# Patient Record
Sex: Female | Born: 1985 | Race: Black or African American | Hispanic: No | Marital: Single | Smoking: Never smoker
Health system: Southern US, Community
[De-identification: ages and names within clinical notes are randomized; demographics above are authoritative.]

## PROBLEM LIST (undated history)

## (undated) DIAGNOSIS — M419 Scoliosis, unspecified: Secondary | ICD-10-CM

## (undated) DIAGNOSIS — M1990 Unspecified osteoarthritis, unspecified site: Secondary | ICD-10-CM

## (undated) DIAGNOSIS — E119 Type 2 diabetes mellitus without complications: Secondary | ICD-10-CM

## (undated) DIAGNOSIS — M549 Dorsalgia, unspecified: Secondary | ICD-10-CM

## (undated) DIAGNOSIS — G43909 Migraine, unspecified, not intractable, without status migrainosus: Secondary | ICD-10-CM

## (undated) HISTORY — PX: CHOLECYSTECTOMY: SHX55

## (undated) HISTORY — PX: FOOT SURGERY: SHX648

---
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
## 2003-07-20 ENCOUNTER — Emergency Department (HOSPITAL_COMMUNITY): Admission: EM | Admit: 2003-07-20 | Discharge: 2003-07-20 | Payer: Self-pay | Admitting: Emergency Medicine

## 2009-12-07 ENCOUNTER — Ambulatory Visit: Payer: Self-pay | Admitting: Diagnostic Radiology

## 2009-12-07 ENCOUNTER — Emergency Department (HOSPITAL_BASED_OUTPATIENT_CLINIC_OR_DEPARTMENT_OTHER): Admission: EM | Admit: 2009-12-07 | Discharge: 2009-12-07 | Payer: Self-pay | Admitting: Emergency Medicine

## 2011-06-09 ENCOUNTER — Encounter: Payer: Self-pay | Admitting: Emergency Medicine

## 2011-06-09 DIAGNOSIS — M795 Residual foreign body in soft tissue: Secondary | ICD-10-CM | POA: Insufficient documentation

## 2011-06-09 DIAGNOSIS — Z1881 Retained glass fragments: Secondary | ICD-10-CM | POA: Insufficient documentation

## 2011-06-09 NOTE — ED Notes (Signed)
Pt presents with knot on bottom on left foot. Pt says she stepped on glass about a month ago and she removed the glass from her foot. Pt says the pain has became worse in the past few days.

## 2011-06-10 ENCOUNTER — Emergency Department (HOSPITAL_BASED_OUTPATIENT_CLINIC_OR_DEPARTMENT_OTHER)
Admission: EM | Admit: 2011-06-10 | Discharge: 2011-06-10 | Disposition: A | Discharge status: Home / Self Care | Payer: Self-pay | Attending: Emergency Medicine | Admitting: Emergency Medicine

## 2011-06-10 DIAGNOSIS — S90852A Superficial foreign body, left foot, initial encounter: Secondary | ICD-10-CM

## 2011-06-10 HISTORY — DX: Scoliosis, unspecified: M41.9

## 2011-06-10 HISTORY — DX: Unspecified osteoarthritis, unspecified site: M19.90

## 2011-06-10 MED ORDER — HYDROCODONE-ACETAMINOPHEN 5-325 MG PO TABS
1.0000 | ORAL_TABLET | Freq: Once | ORAL | Status: AC
  Administered 2011-06-10: 1 via ORAL
  Filled 2011-06-10: qty 1

## 2011-06-10 MED ORDER — MUPIROCIN CALCIUM 2 % EX CREA
TOPICAL_CREAM | Freq: Two times a day (BID) | CUTANEOUS | Status: DC
  Administered 2011-06-10 (×2): via TOPICAL
  Filled 2011-06-10: qty 15

## 2011-06-10 MED ORDER — CEPHALEXIN 500 MG PO CAPS: 500.0000 mg | ORAL_CAPSULE | Freq: Four times a day (QID) | ORAL | Status: AC

## 2011-06-10 MED ORDER — CEPHALEXIN 250 MG PO CAPS
500.0000 mg | ORAL_CAPSULE | Freq: Once | ORAL | Status: AC
  Administered 2011-06-10: 500 mg via ORAL
  Filled 2011-06-10: qty 2

## 2011-06-10 NOTE — ED Provider Notes (Signed)
History   Scribed for Brittany Seamen, MD, the patient was seen in room MH03/MH03 . This chart was scribed by Brittany Oneill. This patient's care was started at 12:56 AM .    CSN: [id number] Arrival date & time: 06/10/2011 12:07 AM  Chief Complaint  Patient presents with  . Foot Injury   HPI Brittany Oneill is a 25 y.o. female who presents to the Emergency Department complaining of knot on bottom of left foot that has been present for 1 day.  Pain has became worse over the past few days. Patient states that she stepped on a piece of glass about a month ago. She thought she removed all of the glass at the time but believes this knot is associated with stepping on glass a month ago. Pt has no SOB, diaphoresis, or acute distress.   HPI ELEMENTS:  Location: left plantar side of foot Onset: yesterday Duration: 1 day   Quality:progressive pain Context: as above       PAST MEDICAL HISTORY:  Past Medical History  Diagnosis Date  . Scoliosis   . Asthma   . Arthritis      PAST SURGICAL HISTORY:  History reviewed. No pertinent past surgical history.   MEDICATIONS:  Previous Medications   HYDROCODONE-ACETAMINOPHEN (NORCO) 10-325 MG PER TABLET    Take 1 tablet by mouth every 6 (six) hours as needed.     KETOROLAC (TORADOL) 10 MG TABLET    Take 10 mg by mouth every 6 (six) hours as needed.       ALLERGIES:  Allergies as of 06/09/2011 - Review Complete 06/09/2011  Allergen Reaction Noted  . Penicillins  06/09/2011  . Sulfa antibiotics  06/09/2011     FAMILY HISTORY:   History reviewed. No pertinent family history.   SOCIAL HISTORY: History  Substance Use Topics  . Smoking status: Never Smoker   . Smokeless tobacco: Not on file  . Alcohol Use: Yes     social    OB History    Grav Para Term Preterm Abortions TAB SAB Ect Mult Living                  Review of Systems 10 Systems reviewed and are negative for acute change except as noted in the HPI.  Physical Exam  BP  127/58  Pulse 67  Temp(Src) 98.7 F (37.1 C) (Oral)  Resp 18  SpO2 99%  Physical Exam General: Well-developed, well-nourished female in no acute distress; appearance consistent with age of record HENT: normocephalic, atraumatic Eyes: pupils equal round and reactive to light; extraocular muscles intact Neck: supple Heart: regular rate and rhythm; no murmurs, rubs or gallops Lungs: clear to auscultation bilaterally Abdomen: soft; nontender; nondistended; no masses or hepatosplenomegaly; bowel sounds present Extremities: No deformity; full range of motion; pulses normal; knot on plantar side of left foot with central punctum Neurologic: Awake, alert and oriented;motor function intact in all extremities and symmetric;sensation grossly intact; no facial droop Skin: Warm and dry  Psychiatric: Normal mood    ED Course  FOREIGN BODY REMOVAL Date/Time: 06/10/2011 1:14 AM Performed by: Brittany Oneill Authorized by: Brittany Oneill Consent: Verbal consent obtained. Intake: sole of left foot. Anesthesia: local infiltration Local anesthetic: lidocaine 2% with epinephrine Anesthetic total: 4 ml Complexity: simple 1 objects recovered. Objects recovered: glass shard Post-procedure assessment: foreign body removed Patient tolerance: Patient tolerated the procedure well with no immediate complications.    MDM Will leave wound open and treat with antibiotic. Patient  advised of possibility of retained foreign body, need for reevaluation if worsening.      Brittany Seamen, MD 06/10/11 [phone]

## 2011-06-10 NOTE — ED Notes (Signed)
Pt is sobbing and shaking. Pt has been given pain medication, dressing applied to incision.

## 2013-08-15 ENCOUNTER — Encounter (HOSPITAL_BASED_OUTPATIENT_CLINIC_OR_DEPARTMENT_OTHER): Payer: Self-pay | Admitting: Emergency Medicine

## 2013-08-15 ENCOUNTER — Emergency Department (HOSPITAL_BASED_OUTPATIENT_CLINIC_OR_DEPARTMENT_OTHER)
Admission: EM | Admit: 2013-08-15 | Discharge: 2013-08-15 | Disposition: A | Discharge status: Home / Self Care | Payer: Medicaid Other | Attending: Emergency Medicine | Admitting: Emergency Medicine

## 2013-08-15 DIAGNOSIS — J45909 Unspecified asthma, uncomplicated: Secondary | ICD-10-CM | POA: Insufficient documentation

## 2013-08-15 DIAGNOSIS — Z791 Long term (current) use of non-steroidal anti-inflammatories (NSAID): Secondary | ICD-10-CM | POA: Insufficient documentation

## 2013-08-15 DIAGNOSIS — R52 Pain, unspecified: Secondary | ICD-10-CM | POA: Insufficient documentation

## 2013-08-15 DIAGNOSIS — M129 Arthropathy, unspecified: Secondary | ICD-10-CM | POA: Insufficient documentation

## 2013-08-15 DIAGNOSIS — Z792 Long term (current) use of antibiotics: Secondary | ICD-10-CM | POA: Insufficient documentation

## 2013-08-15 DIAGNOSIS — G43909 Migraine, unspecified, not intractable, without status migrainosus: Secondary | ICD-10-CM | POA: Insufficient documentation

## 2013-08-15 DIAGNOSIS — Z880 Allergy status to penicillin: Secondary | ICD-10-CM | POA: Insufficient documentation

## 2013-08-15 DIAGNOSIS — Z79899 Other long term (current) drug therapy: Secondary | ICD-10-CM | POA: Insufficient documentation

## 2013-08-15 DIAGNOSIS — J0100 Acute maxillary sinusitis, unspecified: Secondary | ICD-10-CM

## 2013-08-15 HISTORY — DX: Migraine, unspecified, not intractable, without status migrainosus: G43.909

## 2013-08-15 MED ORDER — DOXYCYCLINE HYCLATE 100 MG PO CAPS: 100.0000 mg | ORAL_CAPSULE | Freq: Two times a day (BID) | ORAL | Status: DC

## 2013-08-15 MED ORDER — HYDROCODONE-ACETAMINOPHEN 5-325 MG PO TABS: 2.0000 | ORAL_TABLET | ORAL | Status: DC | PRN

## 2013-08-15 NOTE — ED Provider Notes (Signed)
CSN: [id number]     Arrival date & time 08/15/13  4098 History   First MD Initiated Contact with Patient 08/15/13 0840     Chief Complaint  Patient presents with  . Facial Pain   HPI Patient c/o sinus pain drainage,HA, productive cough for the past three days, generalized body aches. Took OTC cough medicine & allegra D, but no relief. Took flexeril for body aches  Past Medical History  Diagnosis Date  . Scoliosis   . Asthma   . Arthritis   . Migraines    History reviewed. No pertinent past surgical history. No family history on file. History  Substance Use Topics  . Smoking status: Never Smoker   . Smokeless tobacco: Not on file  . Alcohol Use: Yes     Comment: social   OB History   Grav Para Term Preterm Abortions TAB SAB Ect Mult Living                 Review of Systems All other systems reviewed and are negative Allergies  Penicillins and Sulfa antibiotics  Home Medications   Current Outpatient Rx  Name  Route  Sig  Dispense  Refill  . Diclofenac Sodium CR (VOLTAREN-XR) 100 MG 24 hr tablet   Oral   Take 100 mg by mouth daily.         Marland Kitchen tiZANidine (ZANAFLEX) 4 MG tablet   Oral   Take 4 mg by mouth every 6 (six) hours as needed.         . Topiramate (TOPAMAX PO)   Oral   Take by mouth.         . venlafaxine (EFFEXOR) 37.5 MG tablet   Oral   Take 37.5 mg by mouth 2 (two) times daily.         Marland Kitchen doxycycline (VIBRAMYCIN) 100 MG capsule   Oral   Take 1 capsule (100 mg total) by mouth 2 (two) times daily.   20 capsule   0   . HYDROcodone-acetaminophen (NORCO/VICODIN) 5-325 MG per tablet   Oral   Take 2 tablets by mouth every 4 (four) hours as needed for pain.   10 tablet   0   . ketorolac (TORADOL) 10 MG tablet   Oral   Take 10 mg by mouth every 6 (six) hours as needed.            BP 119/64  Pulse 73  Temp(Src) 98.7 F (37.1 C) (Oral)  Resp 19  SpO2 98% Physical Exam  Nursing note and vitals reviewed. Constitutional: She is  oriented to person, place, and time. She appears well-developed and well-nourished. No distress.  HENT:  Head: Normocephalic and atraumatic.    Right Ear: Tympanic membrane is erythematous. A middle ear effusion is present. No decreased hearing is noted.  Left Ear: No drainage or swelling.  No middle ear effusion. No decreased hearing is noted.  Percussion of the indicated areas reproduces pain  Eyes: Pupils are equal, round, and reactive to light.  Neck: Normal range of motion.  Cardiovascular: Normal rate and intact distal pulses.   Pulmonary/Chest: No respiratory distress.  Abdominal: Normal appearance. She exhibits no distension.  Musculoskeletal: Normal range of motion.  Neurological: She is alert and oriented to person, place, and time. No cranial nerve deficit.  Skin: Skin is warm and dry. No rash noted.  Psychiatric: She has a normal mood and affect. Her behavior is normal.    ED Course  Procedures (including critical care time)  Labs Review Labs Reviewed - No data to display Imaging Review No results found.  EKG Interpretation   None       MDM   1. Acute maxillary sinusitis        Nelia Shi, MD 08/15/13 [phone]

## 2013-08-15 NOTE — ED Notes (Addendum)
Patient c/o sinus pain drainage,HA, productive cough for the past three days, generalized body aches. Took OTC cough medicine & allegra D, but no relief. Took flexeril for body aches

## 2013-10-22 ENCOUNTER — Encounter (HOSPITAL_BASED_OUTPATIENT_CLINIC_OR_DEPARTMENT_OTHER): Payer: Self-pay | Admitting: Emergency Medicine

## 2013-10-22 ENCOUNTER — Emergency Department (HOSPITAL_BASED_OUTPATIENT_CLINIC_OR_DEPARTMENT_OTHER)
Admission: EM | Admit: 2013-10-22 | Discharge: 2013-10-23 | Disposition: A | Discharge status: Home / Self Care | Payer: Medicaid Other | Attending: Emergency Medicine | Admitting: Emergency Medicine

## 2013-10-22 DIAGNOSIS — M129 Arthropathy, unspecified: Secondary | ICD-10-CM | POA: Insufficient documentation

## 2013-10-22 DIAGNOSIS — J45909 Unspecified asthma, uncomplicated: Secondary | ICD-10-CM | POA: Insufficient documentation

## 2013-10-22 DIAGNOSIS — M545 Low back pain, unspecified: Secondary | ICD-10-CM | POA: Insufficient documentation

## 2013-10-22 DIAGNOSIS — M62830 Muscle spasm of back: Secondary | ICD-10-CM

## 2013-10-22 DIAGNOSIS — G43909 Migraine, unspecified, not intractable, without status migrainosus: Secondary | ICD-10-CM | POA: Insufficient documentation

## 2013-10-22 DIAGNOSIS — Z880 Allergy status to penicillin: Secondary | ICD-10-CM | POA: Insufficient documentation

## 2013-10-22 DIAGNOSIS — Z791 Long term (current) use of non-steroidal anti-inflammatories (NSAID): Secondary | ICD-10-CM | POA: Insufficient documentation

## 2013-10-22 DIAGNOSIS — M5380 Other specified dorsopathies, site unspecified: Secondary | ICD-10-CM | POA: Insufficient documentation

## 2013-10-22 HISTORY — DX: Dorsalgia, unspecified: M54.9

## 2013-10-22 NOTE — ED Notes (Signed)
Back pain onset 2 days ago getting into tub

## 2013-10-23 MED ORDER — METHOCARBAMOL 500 MG PO TABS
1000.0000 mg | ORAL_TABLET | Freq: Once | ORAL | Status: AC
  Administered 2013-10-23: 1000 mg via ORAL
  Filled 2013-10-23: qty 2

## 2013-10-23 MED ORDER — MELOXICAM 7.5 MG PO TABS: 7.5000 mg | ORAL_TABLET | Freq: Every day | ORAL | Status: DC

## 2013-10-23 MED ORDER — HYDROCODONE-ACETAMINOPHEN 5-325 MG PO TABS: 1.0000 | ORAL_TABLET | Freq: Four times a day (QID) | ORAL | Status: DC | PRN

## 2013-10-23 MED ORDER — METHOCARBAMOL 500 MG PO TABS: 500.0000 mg | ORAL_TABLET | Freq: Two times a day (BID) | ORAL | Status: DC

## 2013-10-23 MED ORDER — KETOROLAC TROMETHAMINE 60 MG/2ML IM SOLN
60.0000 mg | Freq: Once | INTRAMUSCULAR | Status: AC
  Administered 2013-10-23: 60 mg via INTRAMUSCULAR
  Filled 2013-10-23: qty 2

## 2013-10-23 MED ORDER — HYDROCODONE-ACETAMINOPHEN 5-325 MG PO TABS
1.0000 | ORAL_TABLET | Freq: Once | ORAL | Status: AC
  Administered 2013-10-23: 1 via ORAL
  Filled 2013-10-23: qty 1

## 2013-10-23 NOTE — ED Provider Notes (Signed)
CSN: [id number]     Arrival date & time 10/22/13  2326 History   First MD Initiated Contact with Patient 10/23/13 [phone]     Chief Complaint  Patient presents with  . Back Pain   (Consider location/radiation/quality/duration/timing/severity/associated sxs/prior Treatment) Patient is a 28 y.o. female presenting with back pain. The history is provided by the patient.  Back Pain Location:  Sacro-iliac joint Quality:  Cramping Radiates to:  Does not radiate Pain severity:  Severe Pain is:  Same all the time Onset quality:  Gradual Duration:  2 days Timing:  Constant Progression:  Unchanged Chronicity:  Recurrent Context: not emotional stress, not falling, not MCA and not MVA   Relieved by:  Nothing Worsened by:  Nothing tried Ineffective treatments:  None tried Associated symptoms: no abdominal pain, no abdominal swelling, no bladder incontinence, no bowel incontinence, no chest pain, no dysuria, no fever, no headaches, no leg pain, no numbness, no paresthesias, no pelvic pain, no perianal numbness, no tingling, no weakness and no weight loss   Risk factors: no hx of cancer     Past Medical History  Diagnosis Date  . Scoliosis   . Asthma   . Arthritis   . Migraines   . Back pain    Past Surgical History  Procedure Laterality Date  . Cesarean section     No family history on file. History  Substance Use Topics  . Smoking status: Never Smoker   . Smokeless tobacco: Not on file  . Alcohol Use: Yes     Comment: social   OB History   Grav Para Term Preterm Abortions TAB SAB Ect Mult Living                 Review of Systems  Constitutional: Negative for fever and weight loss.  Cardiovascular: Negative for chest pain.  Gastrointestinal: Negative for abdominal pain and bowel incontinence.  Genitourinary: Negative for bladder incontinence, dysuria and pelvic pain.  Musculoskeletal: Positive for back pain.  Neurological: Negative for tingling, weakness, numbness, headaches  and paresthesias.  All other systems reviewed and are negative.    Allergies  Penicillins and Sulfa antibiotics  Home Medications   Current Outpatient Rx  Name  Route  Sig  Dispense  Refill  . cyclobenzaprine (FLEXERIL) 10 MG tablet   Oral   Take 10 mg by mouth 3 (three) times daily as needed for muscle spasms.         . Diclofenac Sodium CR (VOLTAREN-XR) 100 MG 24 hr tablet   Oral   Take 100 mg by mouth daily.         Marland Kitchen ketorolac (TORADOL) 10 MG tablet   Oral   Take 10 mg by mouth every 6 (six) hours as needed.            BP 151/71  Pulse 71  Temp(Src) 98.1 F (36.7 C) (Oral)  Resp 18  Ht 5\' 10"  (1.778 m)  Wt 340 lb (154.223 kg)  BMI 48.78 kg/m2  SpO2 100% Physical Exam  Constitutional: She is oriented to person, place, and time. She appears well-developed and well-nourished. No distress.  HENT:  Head: Normocephalic and atraumatic.  Mouth/Throat: Oropharynx is clear and moist.  Eyes: Conjunctivae are normal. Pupils are equal, round, and reactive to light.  Neck: Normal range of motion. Neck supple.  Cardiovascular: Normal rate, regular rhythm and intact distal pulses.   Pulmonary/Chest: Effort normal and breath sounds normal. She has no wheezes. She has no rales.  Abdominal:  Soft. Bowel sounds are normal. There is no tenderness. There is no rebound and no guarding.  Musculoskeletal: Normal range of motion.  Neurological: She is alert and oriented to person, place, and time. She has normal reflexes.  Intact L5/s1 no step offs nor crepitance nor point tenderness of the c t or l spine.  Mild left lumbar paraspinal tenderness    ED Course  Procedures (including critical care time) Labs Review Labs Reviewed  PREGNANCY, URINE   Imaging Review No results found.  EKG Interpretation   None       MDM  No diagnosis found. Short course of pain medication and muscle relaxants    Marshelle Bilger K Zalaya Astarita-Rasch, MD 10/23/13 [phone]

## 2014-05-19 ENCOUNTER — Emergency Department (HOSPITAL_BASED_OUTPATIENT_CLINIC_OR_DEPARTMENT_OTHER)
Admission: EM | Admit: 2014-05-19 | Discharge: 2014-05-19 | Disposition: A | Discharge status: Home / Self Care | Payer: Medicaid Other | Attending: Emergency Medicine | Admitting: Emergency Medicine

## 2014-05-19 ENCOUNTER — Encounter (HOSPITAL_BASED_OUTPATIENT_CLINIC_OR_DEPARTMENT_OTHER): Payer: Self-pay | Admitting: Emergency Medicine

## 2014-05-19 DIAGNOSIS — Z791 Long term (current) use of non-steroidal anti-inflammatories (NSAID): Secondary | ICD-10-CM | POA: Insufficient documentation

## 2014-05-19 DIAGNOSIS — Z880 Allergy status to penicillin: Secondary | ICD-10-CM | POA: Insufficient documentation

## 2014-05-19 DIAGNOSIS — M129 Arthropathy, unspecified: Secondary | ICD-10-CM | POA: Insufficient documentation

## 2014-05-19 DIAGNOSIS — G43909 Migraine, unspecified, not intractable, without status migrainosus: Secondary | ICD-10-CM | POA: Insufficient documentation

## 2014-05-19 DIAGNOSIS — Z79899 Other long term (current) drug therapy: Secondary | ICD-10-CM | POA: Insufficient documentation

## 2014-05-19 DIAGNOSIS — J45909 Unspecified asthma, uncomplicated: Secondary | ICD-10-CM | POA: Insufficient documentation

## 2014-05-19 DIAGNOSIS — M545 Low back pain, unspecified: Secondary | ICD-10-CM | POA: Insufficient documentation

## 2014-05-19 DIAGNOSIS — M549 Dorsalgia, unspecified: Secondary | ICD-10-CM | POA: Insufficient documentation

## 2014-05-19 DIAGNOSIS — M79605 Pain in left leg: Secondary | ICD-10-CM

## 2014-05-19 DIAGNOSIS — G8921 Chronic pain due to trauma: Secondary | ICD-10-CM | POA: Insufficient documentation

## 2014-05-19 DIAGNOSIS — R209 Unspecified disturbances of skin sensation: Secondary | ICD-10-CM | POA: Insufficient documentation

## 2014-05-19 MED ORDER — CYCLOBENZAPRINE HCL 10 MG PO TABS: 10.0000 mg | ORAL_TABLET | Freq: Two times a day (BID) | ORAL | Status: DC | PRN

## 2014-05-19 MED ORDER — LIDOCAINE 5 % EX PTCH: 1.0000 | MEDICATED_PATCH | CUTANEOUS | Status: DC

## 2014-05-19 NOTE — ED Notes (Signed)
Pt reports chronic back pain on left side from thoracic down to lumbar area with radiation down to foot.  Reports the past several days it has been "different" b/c it has been "shooting".  Reports n/t.

## 2014-05-19 NOTE — ED Notes (Signed)
MD at bedside. 
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
## 2014-05-19 NOTE — Discharge Instructions (Signed)
If you were given medicines take as directed.  If you are on coumadin or contraceptives realize their levels and effectiveness is altered by many different medicines.  If you have any reaction (rash, tongues swelling, other) to the medicines stop taking and see a physician.   Please follow up as directed and return to the ER or see a physician for new or worsening symptoms.  Thank you. Filed Vitals:   05/19/14 0317  BP: 124/47  Pulse: 62  Temp: 98.7 F (37.1 C)  TempSrc: Oral  Resp: 21  Height: 5\' 10"  (1.778 m)  Weight: 340 lb (154.223 kg)  SpO2: 99%

## 2016-03-05 ENCOUNTER — Emergency Department (HOSPITAL_BASED_OUTPATIENT_CLINIC_OR_DEPARTMENT_OTHER)
Admission: EM | Admit: 2016-03-05 | Discharge: 2016-03-05 | Disposition: A | Discharge status: Home / Self Care | Payer: Medicaid Other | Attending: Emergency Medicine | Admitting: Emergency Medicine

## 2016-03-05 ENCOUNTER — Encounter (HOSPITAL_BASED_OUTPATIENT_CLINIC_OR_DEPARTMENT_OTHER): Payer: Self-pay | Admitting: Emergency Medicine

## 2016-03-05 DIAGNOSIS — J45909 Unspecified asthma, uncomplicated: Secondary | ICD-10-CM | POA: Diagnosis not present

## 2016-03-05 DIAGNOSIS — H109 Unspecified conjunctivitis: Secondary | ICD-10-CM | POA: Insufficient documentation

## 2016-03-05 DIAGNOSIS — H5711 Ocular pain, right eye: Secondary | ICD-10-CM | POA: Diagnosis present

## 2016-03-05 DIAGNOSIS — R51 Headache: Secondary | ICD-10-CM | POA: Insufficient documentation

## 2016-03-05 MED ORDER — PROPARACAINE HCL 0.5 % OP SOLN
2.0000 [drp] | Freq: Once | OPHTHALMIC | Status: AC
  Administered 2016-03-05: 2 [drp] via OPHTHALMIC
  Filled 2016-03-05: qty 15

## 2016-03-05 MED ORDER — FLUORESCEIN SODIUM 1 MG OP STRP
1.0000 | ORAL_STRIP | Freq: Once | OPHTHALMIC | Status: AC
  Administered 2016-03-05: 1 via OPHTHALMIC
  Filled 2016-03-05: qty 1

## 2016-03-05 MED ORDER — TOBRAMYCIN 0.3 % OP SOLN
2.0000 [drp] | OPHTHALMIC | Status: DC
  Administered 2016-03-05: 2 [drp] via OPHTHALMIC
  Filled 2016-03-05: qty 5

## 2016-03-05 NOTE — ED Provider Notes (Addendum)
CSN: [id number]     Arrival date & time 03/05/16  0100 History   First MD Initiated Contact with Patient 03/05/16 0122     Chief Complaint  Patient presents with  . 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  She wears prescription glasses but does not wear contact lenses.  She reports no injury or trauma to her eyes.  She states they have both been irritated and she's been rubbing them.  No fevers or chills.  Mild right-sided facial pain as well.  No significant change in her vision.  Pain is mild to moderate in severity   Past Medical History  Diagnosis Date  . Scoliosis   . Asthma   . Arthritis   . Migraines   . Back pain    Past Surgical History  Procedure Laterality Date  . Cesarean section     History reviewed. No pertinent family history. Social History  Substance Use Topics  . Smoking status: Never Smoker   . Smokeless tobacco: None  . Alcohol Use: Yes     Comment: social   OB History    No data available     Review of Systems  All other systems reviewed and are negative.     Allergies  Penicillins and Sulfa antibiotics  Home Medications   Prior to Admission medications   Medication Sig Start Date End Date Taking? Authorizing Provider  cyclobenzaprine (FLEXERIL) 10 MG tablet Take 10 mg by mouth 3 (three) times daily as needed for muscle spasms.    Historical Provider, MD  cyclobenzaprine (FLEXERIL) 10 MG tablet Take 1 tablet (10 mg total) by mouth 2 (two) times daily as needed for muscle spasms. 05/19/14   Elnora Morrison, MD  Diclofenac Sodium CR (VOLTAREN-XR) 100 MG 24 hr tablet Take 100 mg by mouth daily.    Historical Provider, MD  HYDROcodone-acetaminophen (NORCO) 5-325 MG per tablet Take 1 tablet by mouth every 6 (six) hours as needed. 10/23/13   April Palumbo, MD  ketorolac (TORADOL) 10 MG tablet Take 10 mg by mouth every 6 (six)  hours as needed.      Historical Provider, MD  lidocaine (LIDODERM) 5 % Place 1 patch onto the skin daily. Remove & Discard patch within 12 hours or as directed by MD 05/19/14   Elnora Morrison, MD  meloxicam (MOBIC) 7.5 MG tablet Take 1 tablet (7.5 mg total) by mouth daily. 10/23/13   April Palumbo, MD  methocarbamol (ROBAXIN) 500 MG tablet Take 1 tablet (500 mg total) by mouth 2 (two) times daily. 10/23/13   April Palumbo, MD   BP 150/99 mmHg  Pulse 63  Temp(Src) 97.8 F (36.6 C) (Oral)  Resp 18  Ht 5\' 10"  (1.778 m)  Wt 370 lb (167.831 kg)  BMI 53.09 kg/m2  SpO2 100% Physical Exam  Constitutional: She is oriented to person, place, and time. She appears well-developed and well-nourished.  HENT:  Head: Normocephalic and atraumatic.  Right Ear: External ear normal.  Left Ear: External ear normal.  Mouth/Throat: No oropharyngeal exudate.  Eyes: Lids are normal. Pupils are equal, round, and reactive to light. Right eye exhibits no chemosis and no exudate. No foreign body present in the right eye. Left eye exhibits no chemosis and no exudate. No foreign body present in the left eye. Right conjunctiva is injected. Right conjunctiva has no hemorrhage.  Left conjunctiva is injected. Right eye exhibits normal extraocular motion. Left eye exhibits normal extraocular motion.  Slit lamp exam:      The right eye shows no corneal abrasion, no corneal flare, no corneal ulcer, no foreign body, no hyphema, no hypopyon and no fluorescein uptake.  Neck: Normal range of motion.  Pulmonary/Chest: Effort normal.  Abdominal: She exhibits no distension.  Musculoskeletal: Normal range of motion.  Neurological: She is alert and oriented to person, place, and time.  Psychiatric: She has a normal mood and affect.  Nursing note and vitals reviewed.   ED Course  Procedures (including critical care time) Labs Review Labs Reviewed - No data to display  Imaging Review No results found. I have personally reviewed and  evaluated these images and lab results as part of my medical decision-making.   EKG Interpretation None      MDM   Final diagnoses:  Bilateral conjunctivitis    No foreign bodies.  No corneal abrasions.  Patient be treated as suspected conjunctivitis.  Home on tobramycin drops.  First dose in the emergency department.  Outpatient ophthalmology follow-up if her symptoms persist  Patient given the bottle of tobramycin in the emergency department and she will use this at home.   Jola Schmidt, MD 03/05/16 0300  Jola Schmidt, MD 03/05/16 0300

## 2016-03-05 NOTE — Discharge Instructions (Signed)

## 2016-03-05 NOTE — ED Notes (Signed)
Patient reports that she is having pain to her right eye, scelara is red and eye is swollen. Started Friday

## 2016-04-30 ENCOUNTER — Encounter (HOSPITAL_BASED_OUTPATIENT_CLINIC_OR_DEPARTMENT_OTHER): Payer: Self-pay | Admitting: Emergency Medicine

## 2016-04-30 ENCOUNTER — Emergency Department (HOSPITAL_BASED_OUTPATIENT_CLINIC_OR_DEPARTMENT_OTHER): Payer: Medicaid Other

## 2016-04-30 ENCOUNTER — Emergency Department (HOSPITAL_BASED_OUTPATIENT_CLINIC_OR_DEPARTMENT_OTHER)
Admission: EM | Admit: 2016-04-30 | Discharge: 2016-05-01 | Disposition: A | Discharge status: Home / Self Care | Payer: Medicaid Other | Attending: Emergency Medicine | Admitting: Emergency Medicine

## 2016-04-30 DIAGNOSIS — M25571 Pain in right ankle and joints of right foot: Secondary | ICD-10-CM | POA: Diagnosis present

## 2016-04-30 DIAGNOSIS — J45909 Unspecified asthma, uncomplicated: Secondary | ICD-10-CM | POA: Insufficient documentation
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
## 2016-05-01 MED ORDER — NAPROXEN 250 MG PO TABS
500.0000 mg | ORAL_TABLET | Freq: Once | ORAL | Status: AC
  Administered 2016-05-01: 500 mg via ORAL
  Filled 2016-05-01: qty 2

## 2016-05-01 MED ORDER — METHOCARBAMOL 500 MG PO TABS: 500.0000 mg | ORAL_TABLET | Freq: Two times a day (BID) | ORAL | Status: DC

## 2016-05-01 MED ORDER — NAPROXEN 500 MG PO TABS: 500.0000 mg | ORAL_TABLET | Freq: Two times a day (BID) | ORAL | Status: DC

## 2016-05-01 MED ORDER — METHOCARBAMOL 500 MG PO TABS
1000.0000 mg | ORAL_TABLET | Freq: Once | ORAL | Status: DC
Start: 2016-05-01 — End: 2016-05-01
  Filled 2016-05-01: qty 2
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
## 2016-05-01 NOTE — ED Notes (Signed)
Pt denies any further needs at this time.  She drove herself here and tolerated ambulating a few steps to car.

## 2016-05-01 NOTE — Discharge Instructions (Signed)

## 2016-06-03 NOTE — ED Provider Notes (Signed)
[facility] DEPT Provider Note   CSN: [id number] Arrival date & time: 04/30/16  2312  First Provider Contact:  First MD Initiated Contact with Patient 05/01/16 0018        History   Chief Complaint Chief Complaint  Patient presents with  . Ankle Pain  . Burn    HPIHPI   Brittany Oneill is a 30 y.o. female PMH significant for arthritis presenting with right ankle pain. She states that she burned her leg while cooking Sunday afternoon around 1500. Since then, her ambulation has been effected and she fell down and twisted her right ankle. She denies fevers, chills, N/V, changes in bowel/bladder habits, recent surgeries, history of blood clot.   Past Medical History:  Diagnosis Date  . Arthritis   . Asthma   . Back pain   . Migraines   . Scoliosis     There are no active problems to display for this patient.   Past Surgical History:  Procedure Laterality Date  . CESAREAN SECTION      OB History    No data available       Home Medications    Prior to Admission medications   Medication Sig Start Date End Date Taking? Authorizing Provider  cyclobenzaprine (FLEXERIL) 10 MG tablet Take 10 mg by mouth 3 (three) times daily as needed for muscle spasms.    Historical Provider, MD  cyclobenzaprine (FLEXERIL) 10 MG tablet Take 1 tablet (10 mg total) by mouth 2 (two) times daily as needed for muscle spasms. 05/19/14   Elnora Morrison, MD  Diclofenac Sodium CR (VOLTAREN-XR) 100 MG 24 hr tablet Take 100 mg by mouth daily.    Historical Provider, MD  HYDROcodone-acetaminophen (NORCO) 5-325 MG per tablet Take 1 tablet by mouth every 6 (six) hours as needed. 10/23/13   April Palumbo, MD  ketorolac (TORADOL) 10 MG tablet Take 10 mg by mouth every 6 (six) hours as needed.      Historical Provider, MD  lidocaine (LIDODERM) 5 % Place 1 patch onto the skin daily. Remove & Discard patch within 12 hours or as directed by MD 05/19/14   Elnora Morrison, MD  meloxicam (MOBIC) 7.5 MG tablet Take  1 tablet (7.5 mg total) by mouth daily. 10/23/13   April Palumbo, MD  methocarbamol (ROBAXIN) 500 MG tablet Take 1 tablet (500 mg total) by mouth 2 (two) times daily. 05/01/16   [place] Lions, PA-C  naproxen (NAPROSYN) 500 MG tablet Take 1 tablet (500 mg total) by mouth 2 (two) times daily. 05/01/16   [place] Lions, PA-C    Family History No family history on file.  Social History Social History  Substance Use Topics  . Smoking status: Never Smoker  . Smokeless tobacco: Not on file  . Alcohol use Yes     Comment: social     Allergies   Penicillins and Sulfa antibiotics   Review of Systems Review of Systems  Ten systems are reviewed and are negative for acute change except as noted in the HPI   Physical Exam Updated Vital Signs BP (!) 142/109 (BP Location: Right Arm) Comment (BP Location): Right forearm. Cuff not big enough for upper arm.   Pulse 68   Temp 98.8 F (37.1 C) (Oral)   Resp 20   Ht 5\' 10"  (1.778 m)   Wt (!) 163.3 kg   SpO2 100%   BMI 51.65 kg/m   Physical Exam  Constitutional: She appears well-developed and well-nourished. No distress.  HENT:  Head: Normocephalic and atraumatic.  Mouth/Throat: Oropharynx is clear and moist. No oropharyngeal exudate.  Eyes: Conjunctivae are normal. Pupils are equal, round, and reactive to light. Right eye exhibits no discharge. Left eye exhibits no discharge. No scleral icterus.  Neck: No tracheal deviation present.  Cardiovascular: Normal rate, regular rhythm, normal heart sounds and intact distal pulses.  Exam reveals no gallop and no friction rub.   No murmur heard. Pulmonary/Chest: Effort normal and breath sounds normal. No respiratory distress. She has no wheezes. She has no rales. She exhibits no tenderness.  Abdominal: Soft. Bowel sounds are normal. She exhibits no distension and no mass. There is no tenderness. There is no rebound and no guarding.  Musculoskeletal: She exhibits no edema.  Strength  5/5 throughout. Neurovascularly intact BL.   Lymphadenopathy:    She has no cervical adenopathy.  Neurological: She is alert. Coordination normal.  Skin: Skin is warm and dry. No rash noted. She is not diaphoretic. No erythema.  1.5 in superficial first degree burn right lower leg.   Psychiatric: She has a normal mood and affect. Her behavior is normal.  Nursing note and vitals reviewed.    ED Treatments / Results  Labs (all labs ordered are listed, but only abnormal results are displayed) Labs Reviewed - No data to display  EKG  EKG Interpretation None       Radiology No results found.  Procedures Procedures (including critical care time)  Medications Ordered in ED Medications  naproxen (NAPROSYN) tablet 500 mg (500 mg Oral Given 05/01/16 0028)     Initial Impression / Assessment and Plan / ED Course  I have reviewed the triage vital signs and the nursing notes.  Pertinent labs & imaging results that were available during my care of the patient were reviewed by me and considered in my medical decision making (see chart for details).  Clinical Course    Final Clinical Impressions(s) / ED Diagnoses   Final diagnoses:  Ankle pain, right   Well healing burn without infection. Patient X-Ray negative for obvious fracture or dislocation. Pain managed in ED. Pt advised to follow up with orthopedics if symptoms persist for possibility of missed fracture diagnosis. Patient given brace while in ED, conservative therapy recommended and discussed. Patient will be dc home & is agreeable with above plan.   New Prescriptions Discharge Medication List as of 05/01/2016 12:21 AM    START taking these medications   Details  naproxen (NAPROSYN) 500 MG tablet Take 1 tablet (500 mg total) by mouth 2 (two) times daily., Starting 05/01/2016, Until Discontinued, Print         [address] Lions, Hershal Coria 06/03/16 2052    Veatrice Kells, MD 06/07/16 [phone]

## 2016-12-14 ENCOUNTER — Encounter (HOSPITAL_COMMUNITY): Payer: Self-pay | Admitting: Emergency Medicine

## 2016-12-14 ENCOUNTER — Emergency Department (HOSPITAL_COMMUNITY): Payer: Medicaid Other

## 2016-12-14 ENCOUNTER — Emergency Department (HOSPITAL_COMMUNITY)
Admission: EM | Admit: 2016-12-14 | Discharge: 2016-12-14 | Disposition: A | Discharge status: Home / Self Care | Payer: Medicaid Other | Attending: Emergency Medicine | Admitting: Emergency Medicine

## 2016-12-14 DIAGNOSIS — Z77120 Contact with and (suspected) exposure to mold (toxic): Secondary | ICD-10-CM | POA: Insufficient documentation

## 2016-12-14 DIAGNOSIS — J45909 Unspecified asthma, uncomplicated: Secondary | ICD-10-CM | POA: Diagnosis not present

## 2016-12-14 DIAGNOSIS — T7840XA Allergy, unspecified, initial encounter: Secondary | ICD-10-CM | POA: Diagnosis not present

## 2016-12-14 LAB — BASIC METABOLIC PANEL
Anion gap: 3 — ABNORMAL LOW (ref 5–15)
Anion gap: 8 (ref 5–15)
BUN: 6 mg/dL (ref 6–20)
BUN: 9 mg/dL (ref 6–20)
CHLORIDE: 123 mmol/L — AB (ref 101–111)
CO2: 17 mmol/L — AB (ref 22–32)
CO2: 25 mmol/L (ref 22–32)
Calcium: 5.1 mg/dL — CL (ref 8.9–10.3)
Calcium: 8.8 mg/dL — ABNORMAL LOW (ref 8.9–10.3)
Chloride: 106 mmol/L (ref 101–111)
Creatinine, Ser: 0.39 mg/dL — ABNORMAL LOW (ref 0.44–1.00)
Creatinine, Ser: 0.86 mg/dL (ref 0.44–1.00)
GFR calc Af Amer: >60 mL/min (ref >60)
GFR calc non Af Amer: >60 mL/min (ref >60)
GFR calc non Af Amer: >60 mL/min (ref >60)
Glucose, Bld: 88 mg/dL (ref 65–99)
Glucose, Bld: 126 mg/dL — ABNORMAL HIGH (ref 65–99)
POTASSIUM: 3.6 mmol/L (ref 3.5–5.1)
Potassium: 2.1 mmol/L — CL (ref 3.5–5.1)
SODIUM: 143 mmol/L (ref 135–145)
SODIUM: 139 mmol/L (ref 135–145)

## 2016-12-14 LAB — MAGNESIUM: MAGNESIUM: 1.6 mg/dL — AB (ref 1.7–2.4)

## 2016-12-14 LAB — CBC WITH DIFFERENTIAL/PLATELET
Basophils Absolute: 0 10*3/uL (ref 0.0–0.1)
Basophils Relative: 1 %
EOS ABS: 0.1 10*3/uL (ref 0.0–0.7)
Eosinophils Relative: 3 %
HEMATOCRIT: 25 % — AB (ref 36.0–46.0)
HEMOGLOBIN: 8.4 g/dL — AB (ref 12.0–15.0)
LYMPHS ABS: 1.9 10*3/uL (ref 0.7–4.0)
LYMPHS PCT: 44 %
MCH: 28.3 pg (ref 26.0–34.0)
MCHC: 33.6 g/dL (ref 30.0–36.0)
MCV: 84.2 fL (ref 78.0–100.0)
MONOS PCT: 5 %
Monocytes Absolute: 0.2 10*3/uL (ref 0.1–1.0)
NEUTROS ABS: 2 10*3/uL (ref 1.7–7.7)
NEUTROS PCT: 47 %
Platelets: 178 10*3/uL (ref 150–400)
RBC: 2.97 MIL/uL — AB (ref 3.87–5.11)
RDW: 13.9 % (ref 11.5–15.5)
WBC: 4.2 10*3/uL (ref 4.0–10.5)

## 2016-12-14 LAB — I-STAT TROPONIN, ED: TROPONIN I, POC: 0 ng/mL (ref 0.00–0.08)

## 2016-12-14 MED ORDER — SODIUM CHLORIDE 0.9 % IV BOLUS (SEPSIS)
1000.0000 mL | Freq: Once | INTRAVENOUS | Status: AC
Start: 2016-12-14 — End: 2016-12-14
  Administered 2016-12-14: 1000 mL via INTRAVENOUS

## 2016-12-14 MED ORDER — DIPHENHYDRAMINE HCL 50 MG/ML IJ SOLN
25.0000 mg | Freq: Once | INTRAMUSCULAR | Status: AC
  Administered 2016-12-14: 25 mg via INTRAVENOUS
  Filled 2016-12-14: qty 1

## 2016-12-14 MED ORDER — FAMOTIDINE IN NACL 20-0.9 MG/50ML-% IV SOLN
20.0000 mg | INTRAVENOUS | Status: AC
  Administered 2016-12-14: 20 mg via INTRAVENOUS
  Filled 2016-12-14: qty 50

## 2016-12-14 MED ORDER — METHYLPREDNISOLONE SODIUM SUCC 125 MG IJ SOLR
125.0000 mg | Freq: Once | INTRAMUSCULAR | Status: AC
  Administered 2016-12-14: 125 mg via INTRAVENOUS
  Filled 2016-12-14: qty 2

## 2016-12-14 MED ORDER — EPINEPHRINE 0.3 MG/0.3ML IJ SOAJ: 0.3000 mg | Freq: Once | INTRAMUSCULAR | 1 refills | Status: AC | PRN

## 2016-12-14 NOTE — Discharge Instructions (Signed)
I have refilled your epi pen. Follow-up with your primary care doctor. Return to the ED for new or worsening symptoms.

## 2016-12-14 NOTE — ED Notes (Signed)
EKG was delayed pt was with PA and then transported to xray.

## 2016-12-14 NOTE — ED Notes (Signed)
Patient is complaining of having trouble breathing from being exposed to mold. Patient lungs is clear bilaterally. Patient does not have any rashes on her body.

## 2016-12-14 NOTE — ED Notes (Signed)
Bed: WA09 Expected date:  Expected time:  Means of arrival:  Comments: EMS allergic reaction

## 2016-12-14 NOTE — ED Provider Notes (Signed)
[facility] DEPT Provider Note   CSN: [id number] Arrival date & time: 12/14/16  [id number] By signing my name below, I, [name], attest that this documentation has been prepared under the direction and in the presence of non-physician practitioner, Quincy Carnes, PA-C. Electronically Signed: [name], Scribe. 12/14/2016. 12:39 AM.   History   Chief Complaint Chief Complaint  Patient presents with  . Mold Exposure   HPI Brittany Oneill is a 31 y.o. female with a PMHx of asthma who presents to the Emergency Department for evaluation s/p mold exposure yesterday. Pt states she was cleaning her godparent's house and found a black, moldy corner. Per pt, she touched the mold with gloves on and stayed in the room for another hour to finish cleaning. Pt states "I carried the bed and the bed had spots on it" too. She reports associated shortness of breath which has since resolved and chest tightness and generalized "tingling" to her body. She states she used an inhaler, EpiPen, and Zyrtec at 10:30 pm with no relief of chest tightness. Pt denies any breathing difficulties at this time.  The history is provided by the patient. No language interpreter was used.   Past Medical History:  Diagnosis Date  . Arthritis   . Asthma   . Back pain   . Migraines   . Scoliosis     There are no active problems to display for this patient.   Past Surgical History:  Procedure Laterality Date  . CESAREAN SECTION      OB History    No data available      Home Medications    Prior to Admission medications   Medication Sig Start Date End Date Taking? Authorizing Provider  cyclobenzaprine (FLEXERIL) 10 MG tablet Take 10 mg by mouth 3 (three) times daily as needed for muscle spasms.    Historical Provider, MD  cyclobenzaprine (FLEXERIL) 10 MG tablet Take 1 tablet (10 mg total) by mouth 2 (two) times daily as needed for muscle spasms. 05/19/14   Elnora Morrison, MD  Diclofenac Sodium CR  (VOLTAREN-XR) 100 MG 24 hr tablet Take 100 mg by mouth daily.    Historical Provider, MD  HYDROcodone-acetaminophen (NORCO) 5-325 MG per tablet Take 1 tablet by mouth every 6 (six) hours as needed. 10/23/13   April Palumbo, MD  ketorolac (TORADOL) 10 MG tablet Take 10 mg by mouth every 6 (six) hours as needed.      Historical Provider, MD  lidocaine (LIDODERM) 5 % Place 1 patch onto the skin daily. Remove & Discard patch within 12 hours or as directed by MD 05/19/14   Elnora Morrison, MD  meloxicam (MOBIC) 7.5 MG tablet Take 1 tablet (7.5 mg total) by mouth daily. 10/23/13   April Palumbo, MD  methocarbamol (ROBAXIN) 500 MG tablet Take 1 tablet (500 mg total) by mouth 2 (two) times daily. 05/01/16   [place] Lions, PA-C  naproxen (NAPROSYN) 500 MG tablet Take 1 tablet (500 mg total) by mouth 2 (two) times daily. 05/01/16   [place] Lions, PA-C    Family History No family history on file.  Social History Social History  Substance Use Topics  . Smoking status: Never Smoker  . Smokeless tobacco: Not on file  . Alcohol use Yes     Comment: social    Allergies   Penicillins and Sulfa antibiotics  Review of Systems Review of Systems  Constitutional: Negative for fever.  Respiratory: Positive for chest tightness. Negative for shortness of breath.  All other systems reviewed and are negative.  Physical Exam Updated Vital Signs BP 123/87 (BP Location: Right Arm)   Pulse (!) 55   Temp 97.6 F (36.4 C) (Oral)   Resp 22   SpO2 100%   Physical Exam  Constitutional: She is oriented to person, place, and time. She appears well-developed and well-nourished.  HENT:  Head: Normocephalic and atraumatic.  Mouth/Throat: Oropharynx is clear and moist.  Airway clear, no lesions or swelling, handling secretions well, normal phonation without stridor  Eyes: Conjunctivae and EOM are normal. Pupils are equal, round, and reactive to light.  Neck: Normal range of motion.  Cardiovascular:  Normal rate, regular rhythm and normal heart sounds.   Pulmonary/Chest: Effort normal and breath sounds normal. No respiratory distress. She has no wheezes.  Lungs clear, no wheezes or rhonchi, speaking in paragraphs without difficulty  Abdominal: Soft. Bowel sounds are normal.  Musculoskeletal: Normal range of motion.  Neurological: She is alert and oriented to person, place, and time.  AAOx3, answering questions and following commands appropriately; equal strength UE and LE bilaterally; CN grossly intact; moves all extremities appropriately without ataxia; no focal neuro deficits or facial asymmetry appreciated  Skin: Skin is warm and dry.  No rashes  Psychiatric: She has a normal mood and affect.  Nursing note and vitals reviewed.  ED Treatments / Results  DIAGNOSTIC STUDIES:  Oxygen Saturation is 100% on RA, normal by my interpretation.    COORDINATION OF CARE:  12:42 AM Discussed treatment plan with pt at bedside and pt agreed to plan.   Labs (all labs ordered are listed, but only abnormal results are displayed) Labs Reviewed - No data to display  EKG  EKG Interpretation None       Radiology No results found.  Procedures Procedures (including critical care time)  Medications Ordered in ED Medications - No data to display   Initial Impression / Assessment and Plan / ED Course  I have reviewed the triage vital signs and the nursing notes.  Pertinent labs & imaging results that were available during my care of the patient were reviewed by me and considered in my medical decision making (see chart for details).  31 year old female here after exposure to mold.  She has known allergies as well as asthma.  States she had some SOB which resolved but continues to feel "jittery".  She did use her epi pen around 10:30pm.  On arrival here, patient appears well.  She is in no acute respiratory distress.  No airway compromise.  VSS.  States she still has some chest tightness now  and feels jittery.  May be from the epi.  Will plan for labs, CXR, EKG.  Given IV solu-medrol, benadryl.  Initial labs with low potassium and calcium. No EKG changes. These were repeated and are largely within normal limits. Chest x-ray with cardiomegaly and some vascular congestion, but no pulmonary edema.  Troponin is negative. Patient was monitored here for 4 hours, for a total of 6 hours post epi injection without recurrent reaction. Her vitals have remained stable. She remains without any respiratory distress or airway compromise. Feel she is stable for discharge. I refilled her home EpiPen. She will follow-up closely with her primary care doctor.  Discussed plan with patient, she acknowledged understanding and agreed with plan of care.  Return precautions given for new or worsening symptoms.  Final Clinical Impressions(s) / ED Diagnoses   Final diagnoses:  Allergic reaction, initial encounter  Exposure to mold  New Prescriptions Discharge Medication List as of 12/14/2016  3:59 AM    START taking these medications   Details  EPINEPHrine (EPIPEN 2-PAK) 0.3 mg/0.3 mL IJ SOAJ injection Inject 0.3 mLs (0.3 mg total) into the muscle once as needed (for severe allergic reaction). CAll 911 immediately if you have to use this medicine, Starting Fri 12/14/2016, Print       I personally performed the services described in this documentation, which was scribed in my presence. The recorded information has been reviewed and is accurate.   Larene Pickett, PA-C 12/14/16 0500    Veatrice Kells, MD 12/14/16 [phone]

## 2016-12-14 NOTE — ED Triage Notes (Signed)
Patient is complaining of mold exposure. Patient was helping cleaning up parents house. Patient found mold in a corner in parents house and started having tightness of chest, shortness of breath and tingling. Patient went home and showered. She used inhaler, epi pen, and zyrtec. Patient called EMS because the symptoms came back on her way to work.

## 2016-12-14 NOTE — ED Notes (Addendum)
Pt sts, she came in contact with Mold tonight while moving a family member.

## 2016-12-14 NOTE — ED Notes (Signed)
Patient was given meds at 0113. Pt went to get xray.

## 2016-12-17 ENCOUNTER — Emergency Department (HOSPITAL_BASED_OUTPATIENT_CLINIC_OR_DEPARTMENT_OTHER)
Admission: EM | Admit: 2016-12-17 | Discharge: 2016-12-17 | Disposition: A | Discharge status: Home / Self Care | Payer: Medicaid Other | Attending: Emergency Medicine | Admitting: Emergency Medicine

## 2016-12-17 ENCOUNTER — Encounter (HOSPITAL_BASED_OUTPATIENT_CLINIC_OR_DEPARTMENT_OTHER): Payer: Self-pay | Admitting: *Deleted

## 2016-12-17 DIAGNOSIS — L509 Urticaria, unspecified: Secondary | ICD-10-CM | POA: Diagnosis not present

## 2016-12-17 DIAGNOSIS — Z79899 Other long term (current) drug therapy: Secondary | ICD-10-CM | POA: Insufficient documentation

## 2016-12-17 DIAGNOSIS — J45909 Unspecified asthma, uncomplicated: Secondary | ICD-10-CM | POA: Diagnosis not present

## 2016-12-17 MED ORDER — PREDNISONE 20 MG PO TABS: 40.0000 mg | ORAL_TABLET | Freq: Every day | ORAL | 0 refills | Status: DC

## 2016-12-17 MED ORDER — HYDROXYZINE PAMOATE 25 MG PO CAPS: 25.0000 mg | ORAL_CAPSULE | Freq: Three times a day (TID) | ORAL | 0 refills | Status: AC | PRN

## 2016-12-17 NOTE — Discharge Instructions (Signed)
Take prednisone as prescribed until all gone. Take Vistaril for itching. You can continue topical Benadryl or calamine lotion. Please follow-up with your allergist. Avoid any exposure to any triggers.

## 2016-12-17 NOTE — ED Triage Notes (Signed)
Hives off and of x 2 days. States she has been helping a hoarder friend move and she could have come in contact with anything. She took Benadryl this am.

## 2016-12-17 NOTE — ED Provider Notes (Signed)
[facility] DEPT [facility] Provider Note   CSN: [id number] Arrival date & time: 12/17/16  1912  By signing my name below, I, Gwenlyn Fudge, attest that this documentation has been prepared under the direction and in the presence of Aedyn Kempfer, PA-C. Electronically Signed: Gwenlyn Fudge, ED Scribe. 12/17/16. 8:37 PM.  History   Chief Complaint Chief Complaint  Patient presents with  . Urticaria   The history is provided by the patient. No language interpreter was used.   HPI Comments: Brittany Oneill is a 31 y.o. female with PMHx of Arthritis and Asthmawho presents to the Emergency Department complaining of gradual onset, intermittent, hives for 2 days. Pt came into contact with mold 3 days ago and required an Epipen and ED visit. She is unsure of other possible exposures while helping her godparents, but is concerned that something she came into contact with is causing her to break out in hives. Pt has taken Benadryl and used an itch cream this morning with no signifcant relief to symptoms. She denies new soaps/detergenents at home.   Past Medical History:  Diagnosis Date  . Arthritis   . Asthma   . Back pain   . Migraines   . Scoliosis     There are no active problems to display for this patient.   Past Surgical History:  Procedure Laterality Date  . CESAREAN SECTION      OB History    No data available       Home Medications    Prior to Admission medications   Medication Sig Start Date End Date Taking? Authorizing Provider  cyclobenzaprine (FLEXERIL) 10 MG tablet Take 10 mg by mouth 3 (three) times daily as needed for muscle spasms.    Historical Provider, MD  cyclobenzaprine (FLEXERIL) 10 MG tablet Take 1 tablet (10 mg total) by mouth 2 (two) times daily as needed for muscle spasms. 05/19/14   Elnora Morrison, MD  Diclofenac Sodium CR (VOLTAREN-XR) 100 MG 24 hr tablet Take 100 mg by mouth daily.    Historical Provider, MD  EPINEPHrine (EPIPEN 2-PAK) 0.3 mg/0.3  mL IJ SOAJ injection Inject 0.3 mLs (0.3 mg total) into the muscle once as needed (for severe allergic reaction). CAll 911 immediately if you have to use this medicine 12/14/16   Larene Pickett, PA-C  HYDROcodone-acetaminophen [facility]) 5-325 MG per tablet Take 1 tablet by mouth every 6 (six) hours as needed. 10/23/13   April Palumbo, MD  ketorolac (TORADOL) 10 MG tablet Take 10 mg by mouth every 6 (six) hours as needed.      Historical Provider, MD  lidocaine (LIDODERM) 5 % Place 1 patch onto the skin daily. Remove & Discard patch within 12 hours or as directed by MD 05/19/14   Elnora Morrison, MD  meloxicam (MOBIC) 7.5 MG tablet Take 1 tablet (7.5 mg total) by mouth daily. 10/23/13   April Palumbo, MD  methocarbamol (ROBAXIN) 500 MG tablet Take 1 tablet (500 mg total) by mouth 2 (two) times daily. 05/01/16   [place] Lions, PA-C  naproxen (NAPROSYN) 500 MG tablet Take 1 tablet (500 mg total) by mouth 2 (two) times daily. 05/01/16   [place] Lions, PA-C    Family History No family history on file.  Social History Social History  Substance Use Topics  . Smoking status: Never Smoker  . Smokeless tobacco: Never Used  . Alcohol use Yes     Comment: social     Allergies   Penicillins and Sulfa antibiotics  Review of Systems Review of Systems   Physical Exam Updated Vital Signs BP (!) 128/105   Pulse 68   Temp 98.2 F (36.8 C) (Oral)   Resp 20   Ht 5\' 10"  (1.778 m)   Wt (!) 333 lb (151 kg)   SpO2 98%   BMI 47.78 kg/m   Physical Exam  Constitutional: She is oriented to person, place, and time. She appears well-developed and well-nourished. She is active. No distress.  HENT:  Head: Normocephalic and atraumatic.  Normal oral mucosa and lips, no lesions or sores. No uvula swelling. Oropharynx is normal.  Eyes: Conjunctivae are normal.  Cardiovascular: Normal rate, regular rhythm and normal heart sounds.   Pulmonary/Chest: Effort normal and breath sounds normal. No  respiratory distress. She has no wheezes. She has no rales.  No stridor  Musculoskeletal: Normal range of motion.  Neurological: She is alert and oriented to person, place, and time.  Skin: Skin is warm and dry.  Hives to bilateral thighs and forearms.  Psychiatric: She has a normal mood and affect. Her behavior is normal.  Nursing note and vitals reviewed.  ED Treatments / Results  DIAGNOSTIC STUDIES: Oxygen Saturation is 98% on RA, normal by my interpretation.    COORDINATION OF CARE: 8:30 PM Discussed treatment plan with pt at bedside which includes Prednisone and pt agreed to plan.  Labs (all labs ordered are listed, but only abnormal results are displayed) Labs Reviewed - No data to display  EKG  EKG Interpretation None       Radiology No results found.  Procedures Procedures (including critical care time)  Medications Ordered in ED Medications - No data to display   Initial Impression / Assessment and Plan / ED Course  I have reviewed the triage vital signs and the nursing notes.  Pertinent labs & imaging results that were available during my care of the patient were reviewed by me and considered in my medical decision making (see chart for details).    Patient emergency department with hives. She has tried Benadryl, and currently applying Benadryl cream with no relief of itching or hives. She has an extensive list of allergens and states supposed to be getting allergy shots but has not gone in a few weeks. She does not have any evidence of systemic reaction, there is no stridor on exam, there is no swelling of the tongue, lips, oropharynx. She is not feeling shortness of breath at this time. I will treat her with a short burst of steroids and vistaril. She will follow-up with her allergist. Return precautions discussed.   Vitals:   12/17/16 1922  BP: (!) 128/105  Pulse: 68  Resp: 20  Temp: 98.2 F (36.8 C)  TempSrc: Oral  SpO2: 98%  Weight: (!) 151 kg    Height: 5\' 10"  (1.778 m)     Final Clinical Impressions(s) / ED Diagnoses   Final diagnoses:  Hives    New Prescriptions Discharge Medication List as of 12/17/2016  9:02 PM    START taking these medications   Details  hydrOXYzine (VISTARIL) 25 MG capsule Take 1 capsule (25 mg total) by mouth 3 (three) times daily as needed., Starting Mon 12/17/2016, Print    predniSONE (DELTASONE) 20 MG tablet Take 2 tablets (40 mg total) by mouth daily., Starting Mon 12/17/2016, Print         Jeannett Senior, PA-C 12/18/16 [id number]    Merrily Pew, MD 12/18/16 [phone]

## 2016-12-25 ENCOUNTER — Emergency Department (HOSPITAL_COMMUNITY): Payer: Medicaid Other

## 2016-12-25 ENCOUNTER — Emergency Department (HOSPITAL_COMMUNITY)
Admission: EM | Admit: 2016-12-25 | Discharge: 2016-12-25 | Disposition: A | Discharge status: Home / Self Care | Payer: Medicaid Other | Attending: Emergency Medicine | Admitting: Emergency Medicine

## 2016-12-25 ENCOUNTER — Encounter (HOSPITAL_COMMUNITY): Payer: Self-pay

## 2016-12-25 DIAGNOSIS — R609 Edema, unspecified: Secondary | ICD-10-CM

## 2016-12-25 DIAGNOSIS — J45909 Unspecified asthma, uncomplicated: Secondary | ICD-10-CM | POA: Diagnosis not present

## 2016-12-25 DIAGNOSIS — R55 Syncope and collapse: Secondary | ICD-10-CM | POA: Insufficient documentation

## 2016-12-25 DIAGNOSIS — R0609 Other forms of dyspnea: Secondary | ICD-10-CM | POA: Diagnosis not present

## 2016-12-25 DIAGNOSIS — R600 Localized edema: Secondary | ICD-10-CM | POA: Insufficient documentation

## 2016-12-25 LAB — URINALYSIS, ROUTINE W REFLEX MICROSCOPIC
Bilirubin Urine: NEGATIVE
Glucose, UA: NEGATIVE mg/dL
HGB URINE DIPSTICK: NEGATIVE
Ketones, ur: NEGATIVE mg/dL
Leukocytes, UA: NEGATIVE
Nitrite: NEGATIVE
PH: 7 (ref 5.0–8.0)
Protein, ur: NEGATIVE mg/dL
SPECIFIC GRAVITY, URINE: 1.019 (ref 1.005–1.030)

## 2016-12-25 LAB — CBC
HEMATOCRIT: 38 % (ref 36.0–46.0)
HEMOGLOBIN: 12.4 g/dL (ref 12.0–15.0)
MCH: 27.6 pg (ref 26.0–34.0)
MCHC: 32.6 g/dL (ref 30.0–36.0)
MCV: 84.6 fL (ref 78.0–100.0)
Platelets: 277 10*3/uL (ref 150–400)
RBC: 4.49 MIL/uL (ref 3.87–5.11)
RDW: 14.2 % (ref 11.5–15.5)
WBC: 7.3 10*3/uL (ref 4.0–10.5)

## 2016-12-25 LAB — BASIC METABOLIC PANEL
ANION GAP: 6 (ref 5–15)
BUN: 9 mg/dL (ref 6–20)
CHLORIDE: 107 mmol/L (ref 101–111)
CO2: 26 mmol/L (ref 22–32)
Calcium: 8.7 mg/dL — ABNORMAL LOW (ref 8.9–10.3)
Creatinine, Ser: 0.8 mg/dL (ref 0.44–1.00)
GFR calc non Af Amer: >60 mL/min (ref >60)
GLUCOSE: 99 mg/dL (ref 65–99)
POTASSIUM: 3.6 mmol/L (ref 3.5–5.1)
Sodium: 139 mmol/L (ref 135–145)

## 2016-12-25 LAB — BRAIN NATRIURETIC PEPTIDE: B Natriuretic Peptide: 16.3 pg/mL (ref 0.0–100.0)

## 2016-12-25 LAB — I-STAT TROPONIN, ED: TROPONIN I, POC: 0 ng/mL (ref 0.00–0.08)

## 2016-12-25 MED ORDER — DIPHENHYDRAMINE HCL 50 MG/ML IJ SOLN
25.0000 mg | Freq: Once | INTRAMUSCULAR | Status: AC
  Administered 2016-12-25: 25 mg via INTRAVENOUS
  Filled 2016-12-25: qty 1

## 2016-12-25 MED ORDER — FUROSEMIDE 20 MG PO TABS: 20.0000 mg | ORAL_TABLET | Freq: Every day | ORAL | 0 refills | Status: DC

## 2016-12-25 NOTE — ED Notes (Signed)
Pt still unable to void

## 2016-12-25 NOTE — ED Provider Notes (Signed)
[facility] DEPT Provider Note   CSN: [id number] Arrival date & time: 12/25/16  0014     History   Chief Complaint Chief Complaint  Patient presents with  . Loss of Consciousness    HPI Brittany Oneill is a 31 y.o. female.  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 For the last week, she has been having intermittent chest pain and worsening of her baseline exertional dyspnea. She normally walks about 1 mile to work, and normally gets dyspneic if she walks too fast. This past week, she was getting short of breath even walking slowly. She has had intermittent chest discomfort which she describes as a tight feeling. This is not affected by exertion or position or eating. She says that when she arrived at work, she was having some chest pain and she was feeling lightheaded but she tried to work anyway. She is still feeling somewhat lightheaded. When she had her syncopal or near-syncopal episode, she was clammy. There was no associated nausea or vomiting. She is a nonsmoker. She does have history of gestational diabetes, no history of hypertension or hyperlipidemia. There is no family history of premature coronary atherosclerosis.   The history is provided by the patient.    Past Medical History:  Diagnosis Date  . Arthritis   . Asthma   . Back pain   . Migraines   . Scoliosis     There are no active problems to display for this patient.   Past Surgical History:  Procedure Laterality Date  . CESAREAN SECTION      OB History    No data available       Home Medications    Prior to Admission medications   Medication Sig Start Date End Date Taking? Authorizing Provider  cyclobenzaprine (FLEXERIL) 10 MG tablet Take 10 mg by mouth 3 (three) times daily as needed for muscle spasms.    Historical Provider, MD  cyclobenzaprine (FLEXERIL) 10 MG tablet Take 1 tablet (10 mg total) by mouth 2 (two) times daily as needed for muscle spasms. 05/19/14    Elnora Morrison, MD  Diclofenac Sodium CR (VOLTAREN-XR) 100 MG 24 hr tablet Take 100 mg by mouth daily.    Historical Provider, MD  EPINEPHrine (EPIPEN 2-PAK) 0.3 mg/0.3 mL IJ SOAJ injection Inject 0.3 mLs (0.3 mg total) into the muscle once as needed (for severe allergic reaction). CAll 911 immediately if you have to use this medicine 12/14/16   Larene Pickett, PA-C  HYDROcodone-acetaminophen [facility]) 5-325 MG per tablet Take 1 tablet by mouth every 6 (six) hours as needed. 10/23/13   April Palumbo, MD  hydrOXYzine (VISTARIL) 25 MG capsule Take 1 capsule (25 mg total) by mouth 3 (three) times daily as needed. 12/17/16   Tatyana Kirichenko, PA-C  ketorolac (TORADOL) 10 MG tablet Take 10 mg by mouth every 6 (six) hours as needed.      Historical Provider, MD  lidocaine (LIDODERM) 5 % Place 1 patch onto the skin daily. Remove & Discard patch within 12 hours or as directed by MD 05/19/14   Elnora Morrison, MD  meloxicam (MOBIC) 7.5 MG tablet Take 1 tablet (7.5 mg total) by mouth daily. 10/23/13   April Palumbo, MD  methocarbamol (ROBAXIN) 500 MG tablet Take 1 tablet (500 mg total) by mouth 2 (two) times daily. 05/01/16   Greenwood Lions, PA-C  naproxen (NAPROSYN) 500 MG tablet Take 1 tablet (500 mg total) by mouth 2 (two) times daily. 05/01/16  [place] Lions, PA-C  predniSONE (DELTASONE) 20 MG tablet Take 2 tablets (40 mg total) by mouth daily. 12/17/16   Jeannett Senior, PA-C    Family History History reviewed. No pertinent family history.  Social History Social History  Substance Use Topics  . Smoking status: Never Smoker  . Smokeless tobacco: Never Used  . Alcohol use Yes     Comment: social     Allergies   Penicillins and Sulfa antibiotics   Review of Systems Review of Systems  All other systems reviewed and are negative.    Physical Exam Updated Vital Signs BP 148/79   Pulse (!) 57   Temp 98.2 F (36.8 C) (Oral)   Resp 16   SpO2 100%   Physical Exam  Nursing note  and vitals reviewed.  Morbidly obese 31 year old female, resting comfortably and in no acute distress. Vital signs are significant for hypertension. Oxygen saturation is 100%, which is normal. Head is normocephalic and atraumatic. PERRLA, EOMI. Oropharynx is clear. Neck is nontender and supple without adenopathy or JVD. Back is nontender and there is no CVA tenderness. Lungs are clear without rales, wheezes, or rhonchi. Chest is nontender. Heart has regular rate and rhythm without murmur. Abdomen is soft, flat, nontender without masses or hepatosplenomegaly and peristalsis is normoactive. Extremities have 2+ edema, full range of motion is present. Skin is warm and dry without rash. Neurologic: Mental status is normal, cranial nerves are intact, there are no motor or sensory deficits.  ED Treatments / Results  Labs (all labs ordered are listed, but only abnormal results are displayed) Labs Reviewed  BASIC METABOLIC PANEL - Abnormal; Notable for the following:       Result Value   Calcium 8.7 (*)    All other components within normal limits  URINALYSIS, ROUTINE W REFLEX MICROSCOPIC - Abnormal; Notable for the following:    APPearance HAZY (*)    All other components within normal limits  CBC  BRAIN NATRIURETIC PEPTIDE  CBG MONITORING, ED  I-STAT TROPOININ, ED    EKG  EKG Interpretation  Date/Time:  Tuesday December 25 2016 00:39:15 EST Ventricular Rate:  53 PR Interval:  146 QRS Duration: 92 QT Interval:  444 QTC Calculation: 416 R Axis:   78 Text Interpretation:  Sinus bradycardia Cannot rule out Anterior infarct , age undetermined Abnormal ECG When compared with ECG of 12/14/2016, No significant change was found Confirmed by [facility]  MD, Dyllon Henken ([id number]) on 12/25/2016 1:16:37 AM       Radiology Dg Chest 2 View  Result Date: 12/25/2016 CLINICAL DATA:  Syncopal episode at work.  Fluttering stomach. EXAM: CHEST  2 VIEW COMPARISON:  12/14/2016 FINDINGS: Prominent cardiac silhouette,  unchanged. The lungs are clear. There is no pleural effusion. The pulmonary vasculature is normal. Hilar and mediastinal contours are unremarkable unchanged appear IMPRESSION: No active cardiopulmonary disease. Electronically Signed   By: Andreas Newport M.D.   On: 12/25/2016 05:06    Procedures Procedures (including critical care time)  Medications Ordered in ED Medications  diphenhydrAMINE (BENADRYL) injection 25 mg (25 mg Intravenous Given 12/25/16 0515)     Initial Impression / Assessment and Plan / ED Course  I have reviewed the triage vital signs and the nursing notes.  Pertinent labs & imaging results that were available during my care of the patient were reviewed by me and considered in my medical decision making (see chart for details).  Chest pain and dyspnea and episode of near syncope or syncope.  Old records are reviewed, and she was seen in the ED 10 days ago at which time chest x-ray showed significant cardiomegaly and borderline vascular congestion. Given that history, I suspect that she is having symptoms from heart failure. Will screen with BNP and repeat chest x-ray. ECG shows low voltage, which would be consistent with CHF.  Chest x-ray is unchanged. BNP is normal, but can be falsely negative and patient's with significant peripheral edema, and also in patients with significant obesity. With static vital signs did not show significant change in pulse or blood pressure. She will be sent home with prescription for furosemide and is referred to cardiology for further outpatient workup. Return precautions discussed.  Final Clinical Impressions(s) / ED Diagnoses   Final diagnoses:  Near syncope  Exertional dyspnea  Peripheral edema    New Prescriptions New Prescriptions   FUROSEMIDE (LASIX) 20 MG TABLET    Take 1 tablet (20 mg total) by mouth daily.     Delora Fuel, MD [id number]

## 2016-12-25 NOTE — ED Triage Notes (Signed)
Per EMS: Pt complaining of flutters in stomach. Pt states had syncopal episode at work. Pt states not feeling well since this AM. Pt denies any head/neck injury/truama. Pt a/o x 4 at triage, NAD.

## 2016-12-25 NOTE — ED Notes (Addendum)
Pt unable to void at this time. Pt given specimen cup

## 2017-05-09 ENCOUNTER — Emergency Department (HOSPITAL_BASED_OUTPATIENT_CLINIC_OR_DEPARTMENT_OTHER)
Admission: EM | Admit: 2017-05-09 | Discharge: 2017-05-09 | Disposition: A | Discharge status: Home / Self Care | Payer: Medicaid Other | Attending: Physician Assistant | Admitting: Physician Assistant

## 2017-05-09 ENCOUNTER — Encounter (HOSPITAL_BASED_OUTPATIENT_CLINIC_OR_DEPARTMENT_OTHER): Payer: Self-pay | Admitting: Emergency Medicine

## 2017-05-09 DIAGNOSIS — D235 Other benign neoplasm of skin of trunk: Secondary | ICD-10-CM

## 2017-05-09 DIAGNOSIS — D229 Melanocytic nevi, unspecified: Secondary | ICD-10-CM | POA: Insufficient documentation

## 2017-05-09 DIAGNOSIS — Z79899 Other long term (current) drug therapy: Secondary | ICD-10-CM | POA: Insufficient documentation

## 2017-05-09 DIAGNOSIS — J45909 Unspecified asthma, uncomplicated: Secondary | ICD-10-CM | POA: Insufficient documentation

## 2017-05-09 DIAGNOSIS — Z7984 Long term (current) use of oral hypoglycemic drugs: Secondary | ICD-10-CM | POA: Insufficient documentation

## 2017-05-09 NOTE — ED Triage Notes (Signed)
Pt reports left upper back mole x 3 weeks, starting being painful x1 week. Apparent detached mole , mild irritation , painful to touch per pt. denies fever.

## 2017-05-09 NOTE — Discharge Instructions (Signed)
Please make sure you follow up with your Primary Care Physician, for further dermatologic assessment

## 2017-05-09 NOTE — ED Provider Notes (Signed)
[facility] Provider Note   CSN: [id number] Arrival date & time: 05/09/17  0950     History   Chief Complaint Chief Complaint  Patient presents with  . Nevus    back    HPI Brittany Oneill is a 31 y.o. female with a past medical history significant for asthma, migraines and back pain who present today with pedunculated nevus on her back. Patient reports that she noticed mole on her back in December. Mole has been gradually growing in size. Patient reports yesterday that she saw that the mole has grown to the size of a quarter. This morning her mother was moving the mole around and it popped. Patient report some bleeding. Patient has some tenderness in the area. Patient is here today because before the mole ruptured it was flat, since this morning it is raised ( see picture below). Patient denies any fever, chills, CP, SOB, abdominal pain.  HPI  Past Medical History:  Diagnosis Date  . Arthritis   . Asthma   . Back pain   . Migraines   . Scoliosis     There are no active problems to display for this patient.   Past Surgical History:  Procedure Laterality Date  . CESAREAN SECTION      OB History    No data available       Home Medications    Prior to Admission medications   Medication Sig Start Date End Date Taking? Authorizing Provider  albuterol (PROVENTIL HFA;VENTOLIN HFA) 108 (90 Base) MCG/ACT inhaler Inhale 1-2 puffs into the lungs every 6 (six) hours as needed for wheezing or shortness of breath.    [provider]  cyclobenzaprine (FLEXERIL) 10 MG tablet Take 10 mg by mouth 3 (three) times daily as needed for muscle spasms.    [provider]  cyclobenzaprine (FLEXERIL) 10 MG tablet Take 1 tablet (10 mg total) by mouth 2 (two) times daily as needed for muscle spasms. 05/19/14   Elnora Morrison, MD  EPINEPHrine (EPIPEN 2-PAK) 0.3 mg/0.3 mL IJ SOAJ injection Inject 0.3 mLs (0.3 mg total) into the muscle once as needed (for severe  allergic reaction). CAll 911 immediately if you have to use this medicine 12/14/16   Larene Pickett, PA-C  furosemide (LASIX) 20 MG tablet Take 1 tablet (20 mg total) by mouth daily. [birth year]   Delora Fuel, MD  gabapentin (NEURONTIN) 100 MG capsule Take 300 mg by mouth every morning.    [provider]  hydrOXYzine (VISTARIL) 25 MG capsule Take 1 capsule (25 mg total) by mouth 3 (three) times daily as needed. Patient taking differently: Take 25 mg by mouth 3 (three) times daily as needed for anxiety.  12/17/16   Kirichenko, Lahoma Rocker, PA-C  levonorgestrel (MIRENA) 20 MCG/24HR IUD 1 each by Intrauterine route once.    [provider]  meloxicam (MOBIC) 7.5 MG tablet Take 1 tablet (7.5 mg total) by mouth daily. Patient taking differently: Take 7.5 mg by mouth daily as needed for pain.  10/23/13   Palumbo, April, MD  metFORMIN (GLUCOPHAGE) 500 MG tablet Take 500 mg by mouth 2 (two) times daily with a meal.    [provider]  naproxen sodium (ANAPROX) 220 MG tablet Take 220-440 mg by mouth 2 (two) times daily as needed (pain).    [provider]  predniSONE (DELTASONE) 20 MG tablet Take 2 tablets (40 mg total) by mouth daily. 12/17/16   Kirichenko, Lahoma Rocker, PA-C  traMADol (ULTRAM) 50 MG tablet Take  50-100 mg by mouth every 6 (six) hours as needed for moderate pain.    [provider]    Family History History reviewed. No pertinent family history.  Social History Social History  Substance Use Topics  . Smoking status: Never Smoker  . Smokeless tobacco: Never Used  . Alcohol use Yes     Comment: social     Allergies   Penicillins and Sulfa antibiotics   Review of Systems Review of Systems  Constitutional: Negative.   HENT: Negative.   Eyes: Negative.   Respiratory: Negative.   Cardiovascular: Negative.   Gastrointestinal: Negative.   Endocrine: Negative.   Genitourinary: Negative.   Musculoskeletal: Negative.   Skin:       Localized left  upper back tenderness.  Allergic/Immunologic: Negative.   Neurological: Negative.   Hematological: Negative.   Psychiatric/Behavioral: Negative.      Physical Exam    Updated Vital Signs BP (!) 154/106 (BP Location: Right Wrist)   Pulse 63   Temp 98.4 F (36.9 C) (Oral)   Resp 18   Ht 5' 10.5" (1.791 m)   Wt (!) 152 kg (335 lb)   SpO2 100%   BMI 47.39 kg/m   Physical Exam  Constitutional: She is oriented to person, place, and time. She appears well-developed.  HENT:  Head: Normocephalic and atraumatic.  Eyes: Pupils are equal, round, and reactive to light. EOM are normal.  Neck: Normal range of motion.  Cardiovascular: Normal rate, regular rhythm and normal heart sounds.   Pulmonary/Chest: Effort normal and breath sounds normal.  Abdominal: Soft. Bowel sounds are normal.  Musculoskeletal: Normal range of motion.  Neurological: She is alert and oriented to person, place, and time.  Skin:  Raised dark papules on her left upper back, no surrounding erythema. No bleeding, or drainage     ED Treatments / Results  Labs (all labs ordered are listed, but only abnormal results are displayed) Labs Reviewed - No data to display  EKG  EKG Interpretation None       Radiology No results found.  Procedures Procedures (including critical care time)  Medications Ordered in ED Medications - No data to display   Initial Impression / Assessment and Plan / ED Course  Patient presented with pedunculated nevus and some surrounding tenderness without erythema, bleeding or drainage. Patient appears well and vitals are stable. Papule will need to be biopsed by PCP or Dermatology. Patient in agreement with plan.   I have reviewed the triage vital signs and the nursing notes.  Pertinent labs & imaging results that were available during my care of the patient were reviewed by me and considered in my medical decision making (see chart for details).    Final Clinical  Impressions(s) / ED Diagnoses   Final diagnoses:  Dermal nevus of back    New Prescriptions Discharge Medication List as of 05/09/2017 11:27 AM       Marjie Skiff, MD 05/09/17 1228    Macarthur Critchley, MD 05/09/17 1527

## 2018-06-01 ENCOUNTER — Encounter (HOSPITAL_BASED_OUTPATIENT_CLINIC_OR_DEPARTMENT_OTHER): Payer: Self-pay | Admitting: *Deleted

## 2018-06-01 ENCOUNTER — Emergency Department (HOSPITAL_BASED_OUTPATIENT_CLINIC_OR_DEPARTMENT_OTHER)
Admission: EM | Admit: 2018-06-01 | Discharge: 2018-06-01 | Disposition: A | Discharge status: Home / Self Care | Payer: Self-pay | Attending: Emergency Medicine | Admitting: Emergency Medicine

## 2018-06-01 ENCOUNTER — Other Ambulatory Visit: Payer: Self-pay

## 2018-06-01 DIAGNOSIS — Z79899 Other long term (current) drug therapy: Secondary | ICD-10-CM | POA: Insufficient documentation

## 2018-06-01 DIAGNOSIS — R200 Anesthesia of skin: Secondary | ICD-10-CM | POA: Insufficient documentation

## 2018-06-01 DIAGNOSIS — Z7984 Long term (current) use of oral hypoglycemic drugs: Secondary | ICD-10-CM | POA: Insufficient documentation

## 2018-06-01 DIAGNOSIS — J45909 Unspecified asthma, uncomplicated: Secondary | ICD-10-CM | POA: Insufficient documentation

## 2018-06-01 DIAGNOSIS — R0789 Other chest pain: Secondary | ICD-10-CM | POA: Insufficient documentation

## 2018-06-01 MED ORDER — HYDROCODONE-ACETAMINOPHEN 5-325 MG PO TABS: 1.0000 | ORAL_TABLET | ORAL | 0 refills | Status: DC | PRN

## 2018-06-01 NOTE — ED Notes (Signed)
EDP into room, prior to RN assessment, see MD notes, pending orders.   
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
## 2018-10-18 ENCOUNTER — Other Ambulatory Visit: Payer: Self-pay

## 2018-10-18 ENCOUNTER — Emergency Department (HOSPITAL_BASED_OUTPATIENT_CLINIC_OR_DEPARTMENT_OTHER): Payer: Self-pay

## 2018-10-18 ENCOUNTER — Emergency Department (HOSPITAL_BASED_OUTPATIENT_CLINIC_OR_DEPARTMENT_OTHER)
Admission: EM | Admit: 2018-10-18 | Discharge: 2018-10-18 | Disposition: A | Discharge status: Home / Self Care | Payer: Self-pay | Attending: Emergency Medicine | Admitting: Emergency Medicine

## 2018-10-18 ENCOUNTER — Encounter (HOSPITAL_BASED_OUTPATIENT_CLINIC_OR_DEPARTMENT_OTHER): Payer: Self-pay | Admitting: Emergency Medicine

## 2018-10-18 DIAGNOSIS — Z79899 Other long term (current) drug therapy: Secondary | ICD-10-CM | POA: Insufficient documentation

## 2018-10-18 DIAGNOSIS — M419 Scoliosis, unspecified: Secondary | ICD-10-CM | POA: Insufficient documentation

## 2018-10-18 DIAGNOSIS — M25561 Pain in right knee: Secondary | ICD-10-CM | POA: Insufficient documentation

## 2018-10-18 DIAGNOSIS — Z7984 Long term (current) use of oral hypoglycemic drugs: Secondary | ICD-10-CM | POA: Insufficient documentation

## 2018-10-18 DIAGNOSIS — J45909 Unspecified asthma, uncomplicated: Secondary | ICD-10-CM | POA: Insufficient documentation

## 2018-10-18 MED ORDER — OXYCODONE-ACETAMINOPHEN 5-325 MG PO TABS
1.0000 | ORAL_TABLET | Freq: Once | ORAL | Status: AC
  Administered 2018-10-18: 1 via ORAL
  Filled 2018-10-18: qty 1

## 2018-10-18 MED ORDER — IBUPROFEN 400 MG PO TABS
600.0000 mg | ORAL_TABLET | Freq: Once | ORAL | Status: AC
  Administered 2018-10-18: 600 mg via ORAL
  Filled 2018-10-18: qty 1

## 2018-10-18 MED ORDER — DICLOFENAC SODIUM 1 % TD GEL: 4.0000 g | Freq: Four times a day (QID) | TRANSDERMAL | 1 refills | Status: AC

## 2018-10-18 MED ORDER — OXYCODONE-ACETAMINOPHEN 5-325 MG PO TABS: 1.0000 | ORAL_TABLET | Freq: Four times a day (QID) | ORAL | 0 refills | Status: DC | PRN

## 2018-10-18 NOTE — ED Provider Notes (Signed)
[place] EMERGENCY DEPARTMENT Provider Note   CSN: [id number] Arrival date & time: 10/18/18  0848     History   Chief Complaint Chief Complaint  Patient presents with  . Leg Pain    HPI Brittany Oneill is a 32 y.o. female with a past medical history of scoliosis, obesity, back pain who presents today for evaluation of right knee pain for about one month.  She reports that she went to bed and woke up with pain in her right knee.  The pain shoots up to her hip and down to there toes.  She denies any abdominal pain.  No changes to bowel or bladder function.  She has been trying a knee brace, ibuprofen at home.  She has not had any ibuprofen today, however tells me that when she takes it she takes 8 pills at once.  No fevers.  Denies any specific injury today.  She does have mirena IUD.    HPI  Past Medical History:  Diagnosis Date  . Arthritis   . Asthma   . Back pain   . Migraines   . Scoliosis     There are no active problems to display for this patient.   Past Surgical History:  Procedure Laterality Date  . CESAREAN SECTION       OB History   No obstetric history on file.      Home Medications    Prior to Admission medications   Medication Sig Start Date End Date Taking? Authorizing Provider  albuterol (PROVENTIL HFA;VENTOLIN HFA) 108 (90 Base) MCG/ACT inhaler Inhale 1-2 puffs into the lungs every 6 (six) hours as needed for wheezing or shortness of breath.    [provider]  diclofenac sodium (VOLTAREN) 1 % GEL Apply 4 g topically 4 (four) times daily for 14 days. 10/18/18 11/01/18  Lorin Glass, PA-C  EPINEPHrine (EPIPEN 2-PAK) 0.3 mg/0.3 mL IJ SOAJ injection Inject 0.3 mLs (0.3 mg total) into the muscle once as needed (for severe allergic reaction). CAll 911 immediately if you have to use this medicine 12/14/16   Larene Pickett, PA-C  furosemide (LASIX) 20 MG tablet Take 1 tablet (20 mg total) by mouth daily. 01/26/81   Delora Fuel,  MD  gabapentin (NEURONTIN) 100 MG capsule Take 300 mg by mouth every morning.    [provider]  HYDROcodone-acetaminophen (NORCO) 5-325 MG tablet Take 1 tablet by mouth every 4 (four) hours as needed for severe pain. 06/01/18   Molpus, John, MD  hydrOXYzine (VISTARIL) 25 MG capsule Take 1 capsule (25 mg total) by mouth 3 (three) times daily as needed. Patient taking differently: Take 25 mg by mouth 3 (three) times daily as needed for anxiety.  12/17/16   Kirichenko, Lahoma Rocker, PA-C  levonorgestrel (MIRENA) 20 MCG/24HR IUD 1 each by Intrauterine route once.    [provider]  metFORMIN (GLUCOPHAGE) 500 MG tablet Take 500 mg by mouth 2 (two) times daily with a meal.    [provider]  oxyCODONE-acetaminophen (PERCOCET/ROXICET) 5-325 MG tablet Take 1 tablet by mouth every 6 (six) hours as needed for severe pain. 10/18/18   Lorin Glass, PA-C    Family History No family history on file.  Social History Social History   Tobacco Use  . Smoking status: Never Smoker  . Smokeless tobacco: Never Used  Substance Use Topics  . Alcohol use: Yes    Comment: social  . Drug use: No     Allergies   Penicillins  and Sulfa antibiotics   Review of Systems Review of Systems  Constitutional: Negative for activity change and fever.  HENT: Negative for congestion.   Musculoskeletal: Negative for back pain.       Right knee pain  Skin: Negative for color change and wound.  Neurological: Negative for weakness, numbness and headaches.  All other systems reviewed and are negative.    Physical Exam Updated Vital Signs BP 135/89 (BP Location: Right Arm)   Pulse 70   Temp 98.3 F (36.8 C) (Oral)   Resp 18   Ht 5\' 10"  (1.778 m)   Wt (!) 154.2 kg   SpO2 100%   BMI 48.78 kg/m   Physical Exam Vitals signs and nursing note reviewed.  Constitutional:      Appearance: She is obese.  HENT:     Head: Normocephalic.  Cardiovascular:     Pulses: Normal pulses.      Comments: Right foot 2+ DP/PT pulses. Musculoskeletal:     Comments: Anteriorly right knee is generally tender to palpation.  There is mild posterior tenderness to palpation.  Right knee is grossly stable to anterior/posterior drawer test and valgus/varus stress.  There are no obvious deformities to the right knee.  Right hip has full pain-free ROM.  Skin:    General: Skin is warm and dry.     Comments: No abnormal erythema or induration over right knee.  Neurological:     Mental Status: She is alert.     Comments: 5/5 strength right foot plantar and dorsi flexion.  Sensation intact to right leg.      ED Treatments / Results  Labs (all labs ordered are listed, but only abnormal results are displayed) Labs Reviewed - No data to display  EKG None  Radiology US Venous Img Lower Right (dvt Study)  Result Date: 10/18/2018 CLINICAL DATA:  RIGHT leg pain for 1 month, worse today. EXAM: RIGHT LOWER EXTREMITY VENOUS DOPPLER ULTRASOUND TECHNIQUE: Gray-scale sonography with graded compression, as well as color Doppler and duplex ultrasound were performed to evaluate the lower extremity deep venous systems from the level of the common femoral vein and including the common femoral, femoral, profunda femoral, popliteal and calf veins including the posterior tibial, peroneal and gastrocnemius veins when visible. The superficial great saphenous vein was also interrogated. Spectral Doppler was utilized to evaluate flow at rest and with distal augmentation maneuvers in the common femoral, femoral and popliteal veins. COMPARISON:  None. FINDINGS: Contralateral Common Femoral Vein: Respiratory phasicity is normal and symmetric with the symptomatic side. No evidence of thrombus. Normal compressibility. Common Femoral Vein: No evidence of thrombus. Normal compressibility, respiratory phasicity and response to augmentation. Saphenofemoral Junction: No evidence of thrombus. Normal compressibility and flow on  color Doppler imaging. Profunda Femoral Vein: No evidence of thrombus. Normal compressibility and flow on color Doppler imaging. Femoral Vein: No evidence of thrombus. Normal compressibility, respiratory phasicity and response to augmentation. Popliteal Vein: No evidence of thrombus. Normal compressibility, respiratory phasicity and response to augmentation. Calf Veins: Limited evaluation of the calf veins secondary to patient body habitus. Superficial Great Saphenous Vein: No evidence of thrombus. Normal compressibility. Venous Reflux:  None. Other Findings:  None. IMPRESSION: No evidence of deep venous thrombosis. Electronically Signed   By: Nolon Nations M.D.   On: 10/18/2018 12:30   Dg Knee Complete 4 Views Right  Result Date: 10/18/2018 CLINICAL DATA:  Right leg pain for 1 month. EXAM: RIGHT KNEE - COMPLETE 4+ VIEW COMPARISON:  None. FINDINGS: No  evidence of fracture or dislocation. Suggestion of a small effusion. No evidence of arthropathy or other focal bone abnormality. Soft tissues are unremarkable. IMPRESSION: Suggestion of a small effusion. No osseous abnormalities. Electronically Signed   By: Lorriane Shire M.D.   On: 10/18/2018 11:50    Procedures Procedures (including critical care time)  Medications Ordered in ED Medications  oxyCODONE-acetaminophen (PERCOCET/ROXICET) 5-325 MG per tablet 1 tablet (has no administration in time range)  ibuprofen (ADVIL,MOTRIN) tablet 600 mg (600 mg Oral Given 10/18/18 1208)     Initial Impression / Assessment and Plan / ED Course  I have reviewed the triage vital signs and the nursing notes.  Pertinent labs & imaging results that were available during my care of the patient were reviewed by me and considered in my medical decision making (see chart for details).     Patient presents today for evaluation of right knee pain for approximately 1 month.  She denies any known specific injury.  X-rays were obtained showing a suggestion of a small  effusion.  Given that she is on Mirena with posterior calf tenderness DVT study was performed showing no evidence of DVT.  As patient has been taking large doses of ibuprofen do not feel that meloxicam is a safe option for this patient.  Therefore she is given Voltaren gel.  She is advised on safe and appropriate dosing of ibuprofen and Tylenol.  She is given a Percocet in the department along with a prescription for 2 Percocet pills at home so that she can sleep as she reports the pain keeps her from sleeping.  She is given follow-up with Dr. Barbaraann Barthel orthopedics.  She is given crutches while in the department per her request.  She has a knee brace that she states she will continue to use.  PMP database queried for the patient prior to prescription of home narcotic medicines.  Return precautions were discussed with patient who states their understanding.  At the time of discharge patient denied any unaddressed complaints or concerns.  Patient is agreeable for discharge home.   Final Clinical Impressions(s) / ED Diagnoses   Final diagnoses:  Acute pain of right knee    ED Discharge Orders         Ordered    oxyCODONE-acetaminophen (PERCOCET/ROXICET) 5-325 MG tablet  Every 6 hours PRN     10/18/18 1317    diclofenac sodium (VOLTAREN) 1 % GEL  4 times daily     10/18/18 1317           Lorin Glass, [place] 10/18/18 1324    Maudie Flakes, MD 10/19/18 [phone]

## 2018-10-18 NOTE — ED Triage Notes (Signed)
R leg pain x 1 month. Feels like muscle spasms and the skin is sensitive.

## 2018-10-18 NOTE — Discharge Instructions (Addendum)
Please take Ibuprofen (Advil, motrin) and Tylenol (acetaminophen) to relieve your pain.  You may take up to 600 MG (3 pills) of normal strength ibuprofen every 8 hours as needed.  In between doses of ibuprofen you make take tylenol, up to 1,000 mg (two extra strength pills).  Do not take more than 3,000 mg tylenol in a 24 hour period.  Please check all medication labels as many medications such as pain and cold medications may contain tylenol.  Do not drink alcohol while taking these medications.  Do not take other NSAID'S while taking ibuprofen (such as aleve or naproxen).  Please take ibuprofen with food to decrease stomach upset.  Do not take more than recommended.    Today you received medications that may make you sleepy or impair your ability to make decisions.  For the next 24 hours please do not drive, operate heavy machinery, care for a small child with out another adult present, or perform any activities that may cause harm to you or someone else if you were to fall asleep or be impaired.   You are being prescribed a medication which may make you sleepy. Please follow up of listed precautions for at least 24 hours after taking one dose.

## 2020-09-13 ENCOUNTER — Emergency Department (HOSPITAL_BASED_OUTPATIENT_CLINIC_OR_DEPARTMENT_OTHER): Payer: Self-pay

## 2020-09-13 ENCOUNTER — Other Ambulatory Visit: Payer: Self-pay

## 2020-09-13 ENCOUNTER — Emergency Department (HOSPITAL_BASED_OUTPATIENT_CLINIC_OR_DEPARTMENT_OTHER)
Admission: EM | Admit: 2020-09-13 | Discharge: 2020-09-13 | Disposition: A | Discharge status: Home / Self Care | Payer: Self-pay | Attending: Emergency Medicine | Admitting: Emergency Medicine

## 2020-09-13 ENCOUNTER — Encounter (HOSPITAL_BASED_OUTPATIENT_CLINIC_OR_DEPARTMENT_OTHER): Payer: Self-pay

## 2020-09-13 DIAGNOSIS — J45909 Unspecified asthma, uncomplicated: Secondary | ICD-10-CM | POA: Insufficient documentation

## 2020-09-13 DIAGNOSIS — E119 Type 2 diabetes mellitus without complications: Secondary | ICD-10-CM | POA: Insufficient documentation

## 2020-09-13 DIAGNOSIS — Z7984 Long term (current) use of oral hypoglycemic drugs: Secondary | ICD-10-CM | POA: Insufficient documentation

## 2020-09-13 DIAGNOSIS — R202 Paresthesia of skin: Secondary | ICD-10-CM | POA: Insufficient documentation

## 2020-09-13 DIAGNOSIS — L03116 Cellulitis of left lower limb: Secondary | ICD-10-CM | POA: Insufficient documentation

## 2020-09-13 LAB — CBC WITH DIFFERENTIAL/PLATELET
Abs Immature Granulocytes: 0.01 10*3/uL (ref 0.00–0.07)
Basophils Absolute: 0 10*3/uL (ref 0.0–0.1)
Basophils Relative: 1 %
Eosinophils Absolute: 0.3 10*3/uL (ref 0.0–0.5)
Eosinophils Relative: 4 %
HCT: 37.9 % (ref 36.0–46.0)
Hemoglobin: 12.7 g/dL (ref 12.0–15.0)
Immature Granulocytes: 0 %
Lymphocytes Relative: 35 %
Lymphs Abs: 2.4 10*3/uL (ref 0.7–4.0)
MCH: 28.5 pg (ref 26.0–34.0)
MCHC: 33.5 g/dL (ref 30.0–36.0)
MCV: 85 fL (ref 80.0–100.0)
Monocytes Absolute: 0.5 10*3/uL (ref 0.1–1.0)
Monocytes Relative: 8 %
Neutro Abs: 3.6 10*3/uL (ref 1.7–7.7)
Neutrophils Relative %: 52 %
Platelets: 305 10*3/uL (ref 150–400)
RBC: 4.46 MIL/uL (ref 3.87–5.11)
RDW: 12.8 % (ref 11.5–15.5)
WBC: 6.8 10*3/uL (ref 4.0–10.5)
nRBC: 0 % (ref 0.0–0.2)

## 2020-09-13 LAB — BASIC METABOLIC PANEL
Anion gap: 7 (ref 5–15)
BUN: 11 mg/dL (ref 6–20)
CO2: 27 mmol/L (ref 22–32)
Calcium: 8.8 mg/dL — ABNORMAL LOW (ref 8.9–10.3)
Chloride: 101 mmol/L (ref 98–111)
Creatinine, Ser: 0.8 mg/dL (ref 0.44–1.00)
GFR, Estimated: >60 mL/min (ref >60)
Glucose, Bld: 236 mg/dL — ABNORMAL HIGH (ref 70–99)
Potassium: 3.9 mmol/L (ref 3.5–5.1)
Sodium: 135 mmol/L (ref 135–145)

## 2020-09-13 LAB — CBG MONITORING, ED: Glucose-Capillary: 230 mg/dL — ABNORMAL HIGH (ref 70–99)

## 2020-09-13 LAB — D-DIMER, QUANTITATIVE: D-Dimer, Quant: 0.65 ug/mL-FEU — ABNORMAL HIGH (ref 0.00–0.50)

## 2020-09-13 MED ORDER — DOXYCYCLINE HYCLATE 100 MG PO CAPS: 100.0000 mg | ORAL_CAPSULE | Freq: Two times a day (BID) | ORAL | 0 refills | Status: DC

## 2020-09-13 NOTE — ED Notes (Signed)
[place] scheduled for 0800 on 11/24. Pt informed of appointment

## 2020-09-13 NOTE — ED Provider Notes (Signed)
[facility] EMERGENCY DEPARTMENT Provider Note   CSN: [id number] Arrival date & time: 09/13/20  1734     History Chief Complaint  Patient presents with   Leg Problem    Brittany Oneill is a 34 y.o. female.  The history is provided by the patient and medical records.   Brittany Oneill is a 34 y.o. female who presents to the Emergency Department complaining of leg pain. She presents the emergency department complaining of atraumatic left leg pain. She reports one year of paresthesias to the lateral aspect of her left leg.  Over the last two months she has noticed some darkening of the skin around the ankle and distal leg.  Over the last two days she has noticed swelling and redness just proximal to the skin discoloration.  Denies fevers, chest pain, sob, N/V.  She has a hx/o DM and does not take her medications.  No oral contraceptives.  No hx/o DVT/PE.      Past Medical History:  Diagnosis Date   Arthritis    Asthma    Back pain    Migraines    Scoliosis     There are no problems to display for this patient.   Past Surgical History:  Procedure Laterality Date   CESAREAN SECTION       OB History   No obstetric history on file.     No family history on file.  Social History   Tobacco Use   Smoking status: Never Smoker   Smokeless tobacco: Never Used  Vaping Use   Vaping Use: Never used  Substance Use Topics   Alcohol use: Yes    Comment: social   Drug use: No    Home Medications Prior to Admission medications   Medication Sig Start Date End Date Taking? Authorizing Provider  albuterol (PROVENTIL HFA;VENTOLIN HFA) 108 (90 Base) MCG/ACT inhaler Inhale 1-2 puffs into the lungs every 6 (six) hours as needed for wheezing or shortness of breath.    [provider]  doxycycline (VIBRAMYCIN) 100 MG capsule Take 1 capsule (100 mg total) by mouth 2 (two) times daily. 09/13/20   Quintella Reichert, MD  EPINEPHrine (EPIPEN 2-PAK) 0.3  mg/0.3 mL IJ SOAJ injection Inject 0.3 mLs (0.3 mg total) into the muscle once as needed (for severe allergic reaction). CAll 911 immediately if you have to use this medicine 12/14/16   Larene Pickett, PA-C  furosemide (LASIX) 20 MG tablet Take 1 tablet (20 mg total) by mouth daily. 02/28/92   Delora Fuel, MD  gabapentin (NEURONTIN) 100 MG capsule Take 300 mg by mouth every morning.    [provider]  HYDROcodone-acetaminophen (NORCO) 5-325 MG tablet Take 1 tablet by mouth every 4 (four) hours as needed for severe pain. 06/01/18   Molpus, John, MD  hydrOXYzine (VISTARIL) 25 MG capsule Take 1 capsule (25 mg total) by mouth 3 (three) times daily as needed. Patient taking differently: Take 25 mg by mouth 3 (three) times daily as needed for anxiety.  12/17/16   Kirichenko, Lahoma Rocker, PA-C  levonorgestrel (MIRENA) 20 MCG/24HR IUD 1 each by Intrauterine route once.    [provider]  metFORMIN (GLUCOPHAGE) 500 MG tablet Take 500 mg by mouth 2 (two) times daily with a meal.    [provider]  oxyCODONE-acetaminophen (PERCOCET/ROXICET) 5-325 MG tablet Take 1 tablet by mouth every 6 (six) hours as needed for severe pain. 10/18/18   Lorin Glass, PA-C    Allergies    Penicillins, Bee  venom, and Sulfa antibiotics  Review of Systems   Review of Systems  All other systems reviewed and are negative.   Physical Exam Updated Vital Signs BP 130/65 (BP Location: Right Arm)    Pulse (!) 54    Temp 99 F (37.2 C) (Oral)    Resp 19    Ht 5\' 10"  (1.778 m)    Wt (!) 150.1 kg    LMP 09/04/2020    SpO2 99%    BMI 47.49 kg/m   Physical Exam Vitals and nursing note reviewed.  Constitutional:      Appearance: She is well-developed.  HENT:     Head: Normocephalic and atraumatic.  Cardiovascular:     Rate and Rhythm: Normal rate and regular rhythm.     Heart sounds: No murmur heard.   Pulmonary:     Effort: Pulmonary effort is normal. No respiratory distress.     Breath  sounds: Normal breath sounds.  Abdominal:     Palpations: Abdomen is soft.     Tenderness: There is no abdominal tenderness. There is no guarding or rebound.  Musculoskeletal:     Comments: 2+ DP pulses bilaterally. There is one plus edema to the left lower extremity. There are chronic venous stasis changes to the left ankle and distal leg. Just proximal to this area there is erythema and integration approximately 3 x 5 cm in size without any focal fluctuance.   No bony tenderness.  Flexion/extension intact at the ankle.   Skin:    General: Skin is warm and dry.  Neurological:     Mental Status: She is alert and oriented to person, place, and time.  Psychiatric:        Behavior: Behavior normal.     ED Results / Procedures / Treatments   Labs (all labs ordered are listed, but only abnormal results are displayed) Labs Reviewed  BASIC METABOLIC PANEL - Abnormal; Notable for the following components:      Result Value   Glucose, Bld 236 (*)    Calcium 8.8 (*)    All other components within normal limits  D-DIMER, QUANTITATIVE (NOT AT [facility]) - Abnormal; Notable for the following components:   D-Dimer, Quant 0.65 (*)    All other components within normal limits  CBG MONITORING, ED - Abnormal; Notable for the following components:   Glucose-Capillary 230 (*)    All other components within normal limits  CBC WITH DIFFERENTIAL/PLATELET    EKG None  Radiology DG Tibia/Fibula Left  Result Date: 09/13/2020 CLINICAL DATA:  34 year old female with left lower extremity pain and swelling. EXAM: LEFT TIBIA AND FIBULA - 2 VIEW COMPARISON:  None. FINDINGS: There is no acute fracture or dislocation. Mild arthritic changes of the knee. The bones are well mineralized. There is diffuse subcutaneous edema. No radiopaque foreign object or soft tissue gas. IMPRESSION: 1. No acute osseous pathology. 2. Diffuse subcutaneous edema. Electronically Signed   By: Anner Crete M.D.   On: 09/13/2020 19:45      Procedures Procedures (including critical care time)  Medications Ordered in ED Medications - No data to display  ED Course  I have reviewed the triage vital signs and the nursing notes.  Pertinent labs & imaging results that were available during my care of the patient were reviewed by me and considered in my medical decision making (see chart for details).    MDM Rules/Calculators/A&P  Patient with history of diabetes, noncompliant with medications here for evaluation of progressive swelling and pain to her left leg. Examination is concerning for developing cellulitis. D dimer is mildly elevated, unable to get vascular ultrasound at this time. Will have her return tomorrow for vascular ultrasound to rule out DVT. Discussed with patient home care for cellulitis. Discussed outpatient follow-up and return precautions.  Presentation is not consistent with abscess, necrotizing soft tissue infection.  Final Clinical Impression(s) / ED Diagnoses Final diagnoses:  Cellulitis of left lower extremity    Rx / DC Orders ED Discharge Orders         Ordered    US Venous Img Lower Unilateral Left        09/13/20 2018    doxycycline (VIBRAMYCIN) 100 MG capsule  2 times daily        09/13/20 2018           Quintella Reichert, MD 09/13/20 2021

## 2020-09-13 NOTE — ED Triage Notes (Signed)
Pt c/o left LE pain, discoloration x ~3 months-numbness to left LE x years-NAD-limping gait

## 2020-09-13 NOTE — ED Notes (Signed)
Pt states she has uncontrolled diabetes, does not take any medication. Has had numbness to lateral left leg for years

## 2020-09-14 ENCOUNTER — Emergency Department (HOSPITAL_BASED_OUTPATIENT_CLINIC_OR_DEPARTMENT_OTHER)
Admit: 2020-09-14 | Discharge: 2020-09-14 | Disposition: A | Discharge status: Home / Self Care | Payer: Self-pay | Attending: Emergency Medicine | Admitting: Emergency Medicine

## 2020-09-14 NOTE — ED Provider Notes (Signed)
34 yo F seen last night for evaluation of LLE pain, redness and swelling. Unable to obtain U/S last night so returned today for DVT study.  Reviewed U/S done today (09/14/20) which was negative for DVT. I informed patient of the results. Encouraged at home supportive care measures. Instructed patient to take antibiotics as directed.   Portions of this note were generated with [employer]. Dictation errors may occur despite best attempts at proofreading.     Volanda Napoleon, PA-C 09/14/20 [place], [employer], DO 09/14/20 1133

## 2021-05-21 ENCOUNTER — Encounter (HOSPITAL_BASED_OUTPATIENT_CLINIC_OR_DEPARTMENT_OTHER): Payer: Self-pay | Admitting: Emergency Medicine

## 2021-05-21 ENCOUNTER — Emergency Department (HOSPITAL_BASED_OUTPATIENT_CLINIC_OR_DEPARTMENT_OTHER)
Admission: EM | Admit: 2021-05-21 | Discharge: 2021-05-22 | Disposition: A | Discharge status: Home / Self Care | Payer: Self-pay | Attending: Emergency Medicine | Admitting: Emergency Medicine

## 2021-05-21 ENCOUNTER — Emergency Department (HOSPITAL_BASED_OUTPATIENT_CLINIC_OR_DEPARTMENT_OTHER): Payer: Self-pay

## 2021-05-21 ENCOUNTER — Other Ambulatory Visit: Payer: Self-pay

## 2021-05-21 DIAGNOSIS — R112 Nausea with vomiting, unspecified: Secondary | ICD-10-CM | POA: Insufficient documentation

## 2021-05-21 DIAGNOSIS — Z7984 Long term (current) use of oral hypoglycemic drugs: Secondary | ICD-10-CM | POA: Insufficient documentation

## 2021-05-21 DIAGNOSIS — J45909 Unspecified asthma, uncomplicated: Secondary | ICD-10-CM | POA: Insufficient documentation

## 2021-05-21 DIAGNOSIS — E119 Type 2 diabetes mellitus without complications: Secondary | ICD-10-CM | POA: Insufficient documentation

## 2021-05-21 DIAGNOSIS — K819 Cholecystitis, unspecified: Secondary | ICD-10-CM | POA: Insufficient documentation

## 2021-05-21 HISTORY — DX: Type 2 diabetes mellitus without complications: E11.9

## 2021-05-21 LAB — URINALYSIS, ROUTINE W REFLEX MICROSCOPIC
Bilirubin Urine: NEGATIVE
Glucose, UA: >=500 mg/dL — AB
Hgb urine dipstick: NEGATIVE
Ketones, ur: NEGATIVE mg/dL
Leukocytes,Ua: NEGATIVE
Nitrite: NEGATIVE
Protein, ur: NEGATIVE mg/dL
Specific Gravity, Urine: 1.02 (ref 1.005–1.030)
pH: 6 (ref 5.0–8.0)

## 2021-05-21 LAB — CBC
HCT: 38.2 % (ref 36.0–46.0)
Hemoglobin: 13 g/dL (ref 12.0–15.0)
MCH: 28.3 pg (ref 26.0–34.0)
MCHC: 34 g/dL (ref 30.0–36.0)
MCV: 83 fL (ref 80.0–100.0)
Platelets: 307 10*3/uL (ref 150–400)
RBC: 4.6 MIL/uL (ref 3.87–5.11)
RDW: 12.9 % (ref 11.5–15.5)
WBC: 7.2 10*3/uL (ref 4.0–10.5)
nRBC: 0 % (ref 0.0–0.2)

## 2021-05-21 LAB — COMPREHENSIVE METABOLIC PANEL
ALT: 24 U/L (ref 0–44)
AST: 19 U/L (ref 15–41)
Albumin: 3.4 g/dL — ABNORMAL LOW (ref 3.5–5.0)
Alkaline Phosphatase: 73 U/L (ref 38–126)
Anion gap: 7 (ref 5–15)
BUN: 10 mg/dL (ref 6–20)
CO2: 23 mmol/L (ref 22–32)
Calcium: 8.6 mg/dL — ABNORMAL LOW (ref 8.9–10.3)
Chloride: 104 mmol/L (ref 98–111)
Creatinine, Ser: 0.66 mg/dL (ref 0.44–1.00)
GFR, Estimated: >60 mL/min (ref >60)
Glucose, Bld: 309 mg/dL — ABNORMAL HIGH (ref 70–99)
Potassium: 3.8 mmol/L (ref 3.5–5.1)
Sodium: 134 mmol/L — ABNORMAL LOW (ref 135–145)
Total Bilirubin: 0.2 mg/dL — ABNORMAL LOW (ref 0.3–1.2)
Total Protein: 7 g/dL (ref 6.5–8.1)

## 2021-05-21 LAB — URINALYSIS, MICROSCOPIC (REFLEX)

## 2021-05-21 LAB — LIPASE, BLOOD: Lipase: 46 U/L (ref 11–51)

## 2021-05-21 LAB — PREGNANCY, URINE: Preg Test, Ur: NEGATIVE

## 2021-05-21 MED ORDER — METOCLOPRAMIDE HCL 5 MG/ML IJ SOLN
10.0000 mg | Freq: Once | INTRAMUSCULAR | Status: AC
  Administered 2021-05-21: 10 mg via INTRAVENOUS
  Filled 2021-05-21: qty 2

## 2021-05-21 MED ORDER — IOHEXOL 300 MG/ML  SOLN
100.0000 mL | Freq: Once | INTRAMUSCULAR | Status: AC | PRN
  Administered 2021-05-21: 100 mL via INTRAVENOUS

## 2021-05-21 MED ORDER — FAMOTIDINE IN NACL 20-0.9 MG/50ML-% IV SOLN
20.0000 mg | Freq: Once | INTRAVENOUS | Status: AC
  Administered 2021-05-21: 20 mg via INTRAVENOUS
  Filled 2021-05-21: qty 50

## 2021-05-21 MED ORDER — SODIUM CHLORIDE 0.9 % IV BOLUS
1000.0000 mL | Freq: Once | INTRAVENOUS | Status: AC
  Administered 2021-05-21: 1000 mL via INTRAVENOUS

## 2021-05-21 NOTE — ED Provider Notes (Addendum)
[facility] Provider Note   CSN: [id number] Arrival date & time: 05/21/21  [id number]     History Chief Complaint  Patient presents with   Emesis    Brittany Oneill is a 35 y.o. female.  With a past medical history of diabetes.  He presents emergency department with a chief complaint of vomiting and nausea.  She has had constant nausea and vomiting especially after eating for the past 3 weeks.  She is status postcholecystectomy.  Patient is unsure of what her blood sugars have been running because she ran out of testing strips and does not have the money to purchase new ones.  She is also under significant stress financially helping to support her father who is also sick and trying to take care of herself as well.  She denies any diarrhea or constipation.  She denies a history of acid reflux.  She has vomit is consistent with gastric acid and food products.  She denies hematemesis.  She has taken Phenergan without relief of her symptoms.  The history is provided by the patient. No language interpreter was used.  Emesis     Past Medical History:  Diagnosis Date   Arthritis    Asthma    Back pain    Diabetes mellitus without complication ([place])    Migraines    Scoliosis     There are no problems to display for this patient.   Past Surgical History:  Procedure Laterality Date   CESAREAN SECTION     CHOLECYSTECTOMY       OB History   No obstetric history on file.     No family history on file.  Social History   Tobacco Use   Smoking status: Never   Smokeless tobacco: Never  Vaping Use   Vaping Use: Never used  Substance Use Topics   Alcohol use: Yes    Comment: social   Drug use: No    Home Medications Prior to Admission medications   Medication Sig Start Date End Date Taking? Authorizing Provider  ondansetron (ZOFRAN ODT) 4 MG disintegrating tablet Take 1 tablet (4 mg total) by mouth every 8 (eight) hours as needed for nausea or  vomiting. 05/22/21  Yes Haeven Nickle, PA-C  albuterol (PROVENTIL HFA;VENTOLIN HFA) 108 (90 Base) MCG/ACT inhaler Inhale 1-2 puffs into the lungs every 6 (six) hours as needed for wheezing or shortness of breath.    [provider]  doxycycline (VIBRAMYCIN) 100 MG capsule Take 1 capsule (100 mg total) by mouth 2 (two) times daily. 09/13/20   Quintella Reichert, MD  EPINEPHrine (EPIPEN 2-PAK) 0.3 mg/0.3 mL IJ SOAJ injection Inject 0.3 mLs (0.3 mg total) into the muscle once as needed (for severe allergic reaction). CAll 911 immediately if you have to use this medicine 12/14/16   Larene Pickett, PA-C  furosemide (LASIX) 20 MG tablet Take 1 tablet (20 mg total) by mouth daily. [id number]   Delora Fuel, MD  gabapentin (NEURONTIN) 100 MG capsule Take 300 mg by mouth every morning.    [provider]  HYDROcodone-acetaminophen (NORCO) 5-325 MG tablet Take 1 tablet by mouth every 4 (four) hours as needed for severe pain. 06/01/18   Molpus, John, MD  hydrOXYzine (VISTARIL) 25 MG capsule Take 1 capsule (25 mg total) by mouth 3 (three) times daily as needed. Patient taking differently: Take 25 mg by mouth 3 (three) times daily as needed for anxiety.  12/17/16   Jeannett Senior, PA-C  levonorgestrel (MIRENA) 20  MCG/24HR IUD 1 each by Intrauterine route once.    [provider]  metFORMIN (GLUCOPHAGE) 500 MG tablet Take 500 mg by mouth 2 (two) times daily with a meal.    [provider]  oxyCODONE-acetaminophen (PERCOCET/ROXICET) 5-325 MG tablet Take 1 tablet by mouth every 6 (six) hours as needed for severe pain. 10/18/18   Lorin Glass, PA-C    Allergies    Penicillins, Metformin, Bee venom, and Sulfa antibiotics  Review of Systems   Review of Systems Ten systems reviewed and are negative for acute change, except as noted in the HPI.   Physical Exam Updated Vital Signs BP 138/79   Pulse 60   Temp 98.3 F (36.8 C) (Oral)   Resp 19   Ht '5\' 11"'$  (1.803 m)   Wt  (!) 163.3 kg   SpO2 99%   BMI 50.21 kg/m   Physical Exam Vitals and nursing note reviewed.  Constitutional:      General: She is not in acute distress.    Appearance: She is well-developed. She is not diaphoretic.  HENT:     Head: Normocephalic and atraumatic.     Right Ear: External ear normal.     Left Ear: External ear normal.     Nose: Nose normal.     Mouth/Throat:     Mouth: Mucous membranes are moist.  Eyes:     General: No scleral icterus.    Conjunctiva/sclera: Conjunctivae normal.  Cardiovascular:     Rate and Rhythm: Normal rate and regular rhythm.     Heart sounds: Normal heart sounds. No murmur heard.   No friction rub. No gallop.  Pulmonary:     Effort: Pulmonary effort is normal. No respiratory distress.     Breath sounds: Normal breath sounds.  Abdominal:     General: Bowel sounds are normal. There is no distension.     Palpations: Abdomen is soft. There is no mass.     Tenderness: There is no abdominal tenderness. There is no guarding.  Musculoskeletal:     Cervical back: Normal range of motion.  Skin:    General: Skin is warm and dry.  Neurological:     Mental Status: She is alert and oriented to person, place, and time.  Psychiatric:        Behavior: Behavior normal.    ED Results / Procedures / Treatments   Labs (all labs ordered are listed, but only abnormal results are displayed) Labs Reviewed  COMPREHENSIVE METABOLIC PANEL - Abnormal; Notable for the following components:      Result Value   Sodium 134 (*)    Glucose, Bld 309 (*)    Calcium 8.6 (*)    Albumin 3.4 (*)    Total Bilirubin 0.2 (*)    All other components within normal limits  URINALYSIS, ROUTINE W REFLEX MICROSCOPIC - Abnormal; Notable for the following components:   Glucose, UA >=500 (*)    All other components within normal limits  URINALYSIS, MICROSCOPIC (REFLEX) - Abnormal; Notable for the following components:   Bacteria, UA RARE (*)    All other components within  normal limits  LIPASE, BLOOD  CBC  PREGNANCY, URINE    EKG None  Radiology CT Abdomen Pelvis W Contrast  Result Date: 05/21/2021 CLINICAL DATA:  Nausea and vomiting. EXAM: CT ABDOMEN AND PELVIS WITH CONTRAST TECHNIQUE: Multidetector CT imaging of the abdomen and pelvis was performed using the standard protocol following bolus administration of intravenous contrast. CONTRAST:  133m OMNIPAQUE  IOHEXOL 300 MG/ML  SOLN COMPARISON:  CT 12/20/2020 FINDINGS: Lower chest: No acute airspace disease or pleural effusion. Upper normal heart size. Hepatobiliary: Enlarged liver spanning 24.6 cm cranial caudal with steatosis. No focal liver abnormality. Clips in the gallbladder fossa postcholecystectomy. No biliary dilatation. Pancreas: No ductal dilatation or inflammation. Spleen: Normal in size without focal abnormality. Adrenals/Urinary Tract: Normal adrenal glands. No hydronephrosis or perinephric edema. Homogeneous renal enhancement with symmetric excretion on delayed phase imaging. Urinary bladder is partially distended without wall thickening. Stomach/Bowel: Unremarkable stomach containing ingested contents. There is no small bowel obstruction or inflammation. Normal appendix. Mild fecalization of distal small bowel contents. Moderate colonic stool burden. Transverse and sigmoid colon are tortuous. No colonic wall thickening or pericolonic edema. Vascular/Lymphatic: Normal caliber abdominal aorta. Patent portal vein. No acute vascular findings. Prominent lymph nodes in the central small bowel mesentery measuring up to 10 mm, stable from prior exam, typically reactive. No suspicious abdominopelvic adenopathy. Reproductive: Enlarged uterus with multiple fibroids, including dominant fibroid in the fundus spanning 9 cm. Central low density may represent necrosis. IUD is in stable position, likely distorted by adjacent fibroids. Ovaries are not well delineated. No adnexal mass. Other: No free air, free fluid, or  intra-abdominal fluid collection. Musculoskeletal: There are no acute or suspicious osseous abnormalities. Mild thoracic spondylosis. IMPRESSION: 1. No acute abnormality in the abdomen/pelvis. 2. Hepatomegaly and hepatic steatosis. 3. Enlarged uterus with multiple fibroids, including dominant 9 cm fibroid in the fundus. Electronically Signed   By: Keith Rake M.D.   On: 05/21/2021 23:40    Procedures Procedures   Medications Ordered in ED Medications  sodium chloride 0.9 % bolus 1,000 mL ( Intravenous Stopped 05/21/21 2204)  metoCLOPramide (REGLAN) injection 10 mg (10 mg Intravenous Given 05/21/21 2103)  famotidine (PEPCID) IVPB 20 mg premix (0 mg Intravenous Stopped 05/21/21 2142)  iohexol (OMNIPAQUE) 300 MG/ML solution 100 mL (100 mLs Intravenous Contrast Given 05/21/21 2324)    ED Course  I have reviewed the triage vital signs and the nursing notes.  Pertinent labs & imaging results that were available during my care of the patient were reviewed by me and considered in my medical decision making (see chart for details).    MDM Rules/Calculators/A&P                          Latorra Lorman has presented with complaint of vomiting. I have reviewed the labs including CBC, CMP, lipase, urine and urine pregnancy test which show glucosuria and elevated blood glucose, no elevated white blood cell count count or elevated lipase, negative pregnancy test and no evidence of urinary tract infection The differential diagnosis for Jehan Gerrish is extensive and thus medical decision making is of high complexity.  I ordered and reviewed a CT abdomen and pelvis which shows no acute abnormalities  After review of the data points in the case the patient's presentation is most consistent with nausea and vomiting  She was treated in the emergency department with antiemetics and Pepcid with significant improvement and no active vomiting here in the emergency department.   The presentation is not  consistent with emergent causes such as acute coronary syndrome, acute gastric dilatation, acetaminophen toxicity, adrenal insufficiency, appendicitis, salicylate toxicity, bowel obstruction or ileus, carbon monoxide poisoning, cholecystitis, digoxin toxicity, CNS tumor elevated intracranial pressures, outlet obstruction or gastric volvulus, incarcerated hernia, hyperemesis gravidarum, pancreatitis, peritonitis, ruptured viscus, testicular or ovarian torsion, or theophylline toxicity.  Similarly the presentation is not  consistent with biliary colic, cannabinoid hyperemesis syndrome, chemotherapy reaction, gastroparesis, vertigo, migraine, motion sickness, narcotic withdrawal, peptic ulcer disease, renal colic or urinary tract infection.  Repeat examination of the patient shows a benign abdomen and the patient is tolerating p.o. fluids.   I discussed all of the findings with the patient and I have delivered strict return precautions personally or by detailed written instruction delivered verbally by nursing staff to the patient/caregiver/family member.   Data Reviewed/Counseling: I have reviewed the patient's vital signs, nursing notes, and other relevant tests/information. I had a detailed discussion regarding the historical points, exam findings, and any diagnostic results supporting the discharge diagnosis. I also discussed the need for outpatient follow-up and the need to return to the ED if symptoms worsen or if there are any questions or concerns that arise at home.  Final Clinical Impression(s) / ED Diagnoses Final diagnoses:  Non-intractable vomiting with nausea, unspecified vomiting type    Rx / DC Orders ED Discharge Orders          Ordered    ondansetron (ZOFRAN ODT) 4 MG disintegrating tablet  Every 8 hours PRN        05/22/21 0005             Margarita Mail, PA-C 05/22/21 1132    Margarita Mail, PA-C 05/22/21 1132    Tegeler, Gwenyth Allegra, MD 05/22/21 [phone]

## 2021-05-21 NOTE — ED Triage Notes (Signed)
Pt reports vomiting x 3 weeks; pt denies being seen for it, pt reports she does not have a gallbladder; pt denies any pain

## 2021-05-22 MED ORDER — ONDANSETRON 4 MG PO TBDP: 4.0000 mg | ORAL_TABLET | Freq: Three times a day (TID) | ORAL | 0 refills | Status: DC | PRN

## 2021-05-22 NOTE — Discharge Instructions (Addendum)

## 2021-10-29 ENCOUNTER — Emergency Department (HOSPITAL_BASED_OUTPATIENT_CLINIC_OR_DEPARTMENT_OTHER)
Admission: EM | Admit: 2021-10-29 | Discharge: 2021-10-29 | Disposition: A | Discharge status: Home / Self Care | Payer: 59 | Attending: Emergency Medicine | Admitting: Emergency Medicine

## 2021-10-29 ENCOUNTER — Emergency Department (HOSPITAL_BASED_OUTPATIENT_CLINIC_OR_DEPARTMENT_OTHER): Payer: 59

## 2021-10-29 ENCOUNTER — Other Ambulatory Visit: Payer: Self-pay

## 2021-10-29 ENCOUNTER — Encounter (HOSPITAL_BASED_OUTPATIENT_CLINIC_OR_DEPARTMENT_OTHER): Payer: Self-pay | Admitting: Emergency Medicine

## 2021-10-29 DIAGNOSIS — Z7984 Long term (current) use of oral hypoglycemic drugs: Secondary | ICD-10-CM | POA: Insufficient documentation

## 2021-10-29 DIAGNOSIS — K760 Fatty (change of) liver, not elsewhere classified: Secondary | ICD-10-CM

## 2021-10-29 DIAGNOSIS — R1032 Left lower quadrant pain: Secondary | ICD-10-CM | POA: Insufficient documentation

## 2021-10-29 DIAGNOSIS — E119 Type 2 diabetes mellitus without complications: Secondary | ICD-10-CM | POA: Diagnosis not present

## 2021-10-29 DIAGNOSIS — J45909 Unspecified asthma, uncomplicated: Secondary | ICD-10-CM | POA: Insufficient documentation

## 2021-10-29 DIAGNOSIS — D259 Leiomyoma of uterus, unspecified: Secondary | ICD-10-CM

## 2021-10-29 DIAGNOSIS — R1030 Lower abdominal pain, unspecified: Secondary | ICD-10-CM | POA: Diagnosis present

## 2021-10-29 LAB — URINALYSIS, ROUTINE W REFLEX MICROSCOPIC
Bilirubin Urine: NEGATIVE
Glucose, UA: 100 mg/dL — AB
Hgb urine dipstick: NEGATIVE
Ketones, ur: NEGATIVE mg/dL
Nitrite: NEGATIVE
Protein, ur: 30 mg/dL — AB
Specific Gravity, Urine: 1.02 (ref 1.005–1.030)
pH: 7 (ref 5.0–8.0)

## 2021-10-29 LAB — CBC WITH DIFFERENTIAL/PLATELET
Abs Immature Granulocytes: 0.01 10*3/uL (ref 0.00–0.07)
Basophils Absolute: 0.1 10*3/uL (ref 0.0–0.1)
Basophils Relative: 1 %
Eosinophils Absolute: 0.2 10*3/uL (ref 0.0–0.5)
Eosinophils Relative: 4 %
HCT: 40.6 % (ref 36.0–46.0)
Hemoglobin: 13.6 g/dL (ref 12.0–15.0)
Immature Granulocytes: 0 %
Lymphocytes Relative: 39 %
Lymphs Abs: 2.5 10*3/uL (ref 0.7–4.0)
MCH: 28.1 pg (ref 26.0–34.0)
MCHC: 33.5 g/dL (ref 30.0–36.0)
MCV: 83.9 fL (ref 80.0–100.0)
Monocytes Absolute: 0.4 10*3/uL (ref 0.1–1.0)
Monocytes Relative: 6 %
Neutro Abs: 3.1 10*3/uL (ref 1.7–7.7)
Neutrophils Relative %: 50 %
Platelets: 314 10*3/uL (ref 150–400)
RBC: 4.84 MIL/uL (ref 3.87–5.11)
RDW: 12.8 % (ref 11.5–15.5)
WBC: 6.3 10*3/uL (ref 4.0–10.5)
nRBC: 0 % (ref 0.0–0.2)

## 2021-10-29 LAB — COMPREHENSIVE METABOLIC PANEL
ALT: 24 U/L (ref 0–44)
AST: 19 U/L (ref 15–41)
Albumin: 3.7 g/dL (ref 3.5–5.0)
Alkaline Phosphatase: 59 U/L (ref 38–126)
Anion gap: 8 (ref 5–15)
BUN: 8 mg/dL (ref 6–20)
CO2: 26 mmol/L (ref 22–32)
Calcium: 9.2 mg/dL (ref 8.9–10.3)
Chloride: 100 mmol/L (ref 98–111)
Creatinine, Ser: 0.76 mg/dL (ref 0.44–1.00)
GFR, Estimated: >60 mL/min (ref >60)
Glucose, Bld: 231 mg/dL — ABNORMAL HIGH (ref 70–99)
Potassium: 3.7 mmol/L (ref 3.5–5.1)
Sodium: 134 mmol/L — ABNORMAL LOW (ref 135–145)
Total Bilirubin: 0.2 mg/dL — ABNORMAL LOW (ref 0.3–1.2)
Total Protein: 7.2 g/dL (ref 6.5–8.1)

## 2021-10-29 LAB — RAPID URINE DRUG SCREEN, HOSP PERFORMED
Amphetamines: NOT DETECTED
Barbiturates: NOT DETECTED
Benzodiazepines: NOT DETECTED
Cocaine: NOT DETECTED
Opiates: NOT DETECTED
Tetrahydrocannabinol: NOT DETECTED

## 2021-10-29 LAB — URINALYSIS, MICROSCOPIC (REFLEX)

## 2021-10-29 LAB — PREGNANCY, URINE: Preg Test, Ur: NEGATIVE

## 2021-10-29 LAB — LIPASE, BLOOD: Lipase: 39 U/L (ref 11–51)

## 2021-10-29 MED ORDER — LISINOPRIL 10 MG PO TABS: 10.0000 mg | ORAL_TABLET | Freq: Every day | ORAL | 0 refills | Status: AC

## 2021-10-29 MED ORDER — GLIPIZIDE 5 MG PO TABS: 5.0000 mg | ORAL_TABLET | Freq: Once | ORAL | Status: DC

## 2021-10-29 MED ORDER — IOHEXOL 300 MG/ML  SOLN
100.0000 mL | Freq: Once | INTRAMUSCULAR | Status: AC | PRN
  Administered 2021-10-29: 100 mL via INTRAVENOUS

## 2021-10-29 MED ORDER — MORPHINE SULFATE (PF) 4 MG/ML IV SOLN
4.0000 mg | Freq: Once | INTRAVENOUS | Status: AC
  Administered 2021-10-29: 4 mg via INTRAVENOUS
  Filled 2021-10-29: qty 1

## 2021-10-29 MED ORDER — SODIUM CHLORIDE 0.9 % IV BOLUS
1000.0000 mL | Freq: Once | INTRAVENOUS | Status: AC
Start: 2021-10-29 — End: 2021-10-29
  Administered 2021-10-29: 1000 mL via INTRAVENOUS

## 2021-10-29 MED ORDER — GLIPIZIDE 5 MG PO TABS: 5.0000 mg | ORAL_TABLET | Freq: Every day | ORAL | 0 refills | Status: AC

## 2021-10-29 MED ORDER — ONDANSETRON HCL 4 MG/2ML IJ SOLN
4.0000 mg | Freq: Once | INTRAMUSCULAR | Status: AC
  Administered 2021-10-29: 4 mg via INTRAVENOUS
  Filled 2021-10-29: qty 2

## 2021-10-29 NOTE — ED Triage Notes (Signed)
Reports lower abdominal pain for the last three weeks with fluttering at times.  Also having n/v.  Had spotting at the end of December.

## 2021-10-29 NOTE — ED Provider Notes (Signed)
[facility] Provider Note   CSN: [id number] Arrival date & time: 10/29/21  1859     History  Chief Complaint  Patient presents with   Abdominal Pain    Brittany Oneill is a 36 y.o. female.  HPI  This is a 36 year old female with history of asthma and diabetes presenting due to lower abdominal pain x3 weeks.  Initially it was constant, now it comes and goes.  Its primarily lower and left-sided abdominal pain, unable to identify any provoking features.  It is associate with nausea and vomiting, food makes the nausea and vomiting worse.  Denies any diarrhea or constipation, not having any fevers at home.  She reports she has an IUD that was supposed to be removed in October 2022, at that time the health department was unable to identify any strings.  Patient has not had follow-up with gynecology since then.  Denies any vaginal discharge, denies any vaginal pain.  Reports her menstrual cycle has been irregular for the last 3 months.  No dysuria or hematuria.  Surgical history: Patient is status post C-section and cholecystectomy.  Past Medical History:  Diagnosis Date   Arthritis    Asthma    Back pain    Diabetes mellitus without complication ([place])    Migraines    Scoliosis      Home Medications Prior to Admission medications   Medication Sig Start Date End Date Taking? Authorizing Provider  albuterol (PROVENTIL HFA;VENTOLIN HFA) 108 (90 Base) MCG/ACT inhaler Inhale 1-2 puffs into the lungs every 6 (six) hours as needed for wheezing or shortness of breath.    [provider]  doxycycline (VIBRAMYCIN) 100 MG capsule Take 1 capsule (100 mg total) by mouth 2 (two) times daily. 09/13/20   Quintella Reichert, MD  EPINEPHrine (EPIPEN 2-PAK) 0.3 mg/0.3 mL IJ SOAJ injection Inject 0.3 mLs (0.3 mg total) into the muscle once as needed (for severe allergic reaction). CAll 911 immediately if you have to use this medicine 12/14/16   Larene Pickett, PA-C   furosemide (LASIX) 20 MG tablet Take 1 tablet (20 mg total) by mouth daily. 10/27/58   Delora Fuel, MD  gabapentin (NEURONTIN) 100 MG capsule Take 300 mg by mouth every morning.    [provider]  HYDROcodone-acetaminophen (NORCO) 5-325 MG tablet Take 1 tablet by mouth every 4 (four) hours as needed for severe pain. 06/01/18   Molpus, John, MD  hydrOXYzine (VISTARIL) 25 MG capsule Take 1 capsule (25 mg total) by mouth 3 (three) times daily as needed. Patient taking differently: Take 25 mg by mouth 3 (three) times daily as needed for anxiety.  12/17/16   Kirichenko, Lahoma Rocker, PA-C  levonorgestrel (MIRENA) 20 MCG/24HR IUD 1 each by Intrauterine route once.    [provider]  metFORMIN (GLUCOPHAGE) 500 MG tablet Take 500 mg by mouth 2 (two) times daily with a meal.    [provider]  ondansetron (ZOFRAN ODT) 4 MG disintegrating tablet Take 1 tablet (4 mg total) by mouth every 8 (eight) hours as needed for nausea or vomiting. 05/22/21   Margarita Mail, PA-C  oxyCODONE-acetaminophen (PERCOCET/ROXICET) 5-325 MG tablet Take 1 tablet by mouth every 6 (six) hours as needed for severe pain. 10/18/18   Lorin Glass, PA-C      Allergies    Penicillins, Metformin, Bee venom, and Sulfa antibiotics    Review of Systems   Review of Systems  Constitutional:  Negative for chills and fever.  HENT:  Negative  for ear pain and sore throat.   Eyes:  Negative for pain and visual disturbance.  Respiratory:  Negative for cough and shortness of breath.   Cardiovascular:  Negative for chest pain and palpitations.  Gastrointestinal:  Positive for abdominal pain, nausea and vomiting. Negative for constipation and diarrhea.  Genitourinary:  Positive for pelvic pain. Negative for dysuria, hematuria, menstrual problem, vaginal bleeding, vaginal discharge and vaginal pain.  Musculoskeletal:  Negative for arthralgias and back pain.  Skin:  Negative for color change and rash.  Neurological:   Negative for seizures and syncope.  All other systems reviewed and are negative.  Physical Exam Updated Vital Signs BP (!) 148/93 (BP Location: Right Arm)    Pulse 62    Temp 97.7 F (36.5 C) (Oral)    Resp 18    Ht 5\' 10"  (1.778 m)    Wt (!) 163 kg    LMP 10/20/2021    SpO2 100%    BMI 51.56 kg/m  Physical Exam Vitals and nursing note reviewed. Exam conducted with a chaperone present.  Constitutional:      Appearance: Normal appearance. She is obese.  HENT:     Head: Normocephalic and atraumatic.  Eyes:     General: No scleral icterus.       Right eye: No discharge.        Left eye: No discharge.     Extraocular Movements: Extraocular movements intact.     Pupils: Pupils are equal, round, and reactive to light.  Cardiovascular:     Rate and Rhythm: Normal rate and regular rhythm.     Pulses: Normal pulses.     Heart sounds: Normal heart sounds. No murmur heard.   No friction rub. No gallop.  Pulmonary:     Effort: Pulmonary effort is normal. No respiratory distress.     Breath sounds: Normal breath sounds.  Abdominal:     General: Abdomen is flat. A surgical scar is present. Bowel sounds are normal. There is no distension.     Palpations: Abdomen is soft.     Tenderness: There is abdominal tenderness in the suprapubic area and left lower quadrant. There is no guarding.     Comments: Suprapubic and left lower quadrant tenderness, abdomen is soft without rigidity.  No CVA tenderness.  Skin:    General: Skin is warm and dry.     Coloration: Skin is not jaundiced.  Neurological:     Mental Status: She is alert. Mental status is at baseline.     Coordination: Coordination normal.   ED Results / Procedures / Treatments   Labs (all labs ordered are listed, but only abnormal results are displayed) Labs Reviewed  CBC WITH DIFFERENTIAL/PLATELET  COMPREHENSIVE METABOLIC PANEL  LIPASE, BLOOD  URINALYSIS, ROUTINE W REFLEX MICROSCOPIC  PREGNANCY, URINE  RAPID URINE DRUG SCREEN,  HOSP PERFORMED    EKG None  Radiology No results found.  Procedures Procedures    Medications Ordered in ED Medications - No data to display  ED Course/ Medical Decision Making/ A&P                           Medical Decision Making Amount and/or Complexity of Data Reviewed External Data Reviewed: radiology.    Details: 05/12/21 - CT abdomen/pelvis with findings of enlarged uterus with multiple fibroids, including dominant 9 cm fibroid in the fundus.   This is a 36 year old female with lower abdominal pain.  History is notable  for status post cholecystectomy and C-section, she is also diabetic with history of hypertension.  Abdomen is without any peritoneal signs, there is diffuse lower abdominal in nature.  There is no guarding, no rigidity.  Also no CVA tenderness making nephrolithiasis less likely.  Her vitals remained stable, no fever or tachycardia.  Will work-up with abdominal labs and UA.   Labs and imaging interpreted by myself personally.  UDS is negative, I doubt her emesis is secondary to cannabis hyperemesis.   Lipase negative, additionally presentation is not consistent with pancreatitis.   No gross electrolyte derangement on CMP, glucose is elevated likely secondary to her underlying diabetes.  There is no evidence of AKI or LFT elevation concerning for cholestatic or hepatitis pattern.  CBC without any signs of anemia, additionally no leukocytosis concerning for an acute intra-abdominal process.  Overall, this is reassuring.   UA does not show any signs of UTI, there is some proteinuria and glucosuria.  Lab work-up is not consistent with a DKA picture, additionally no evidence of UTI.    I discussed the results with the patient, given their chronicity of her pain she would feel more comfortable a CT abdomen.  We engaged in shared decision-making and will proceed with CT to rule out any other process.  I did consider proceeding with an ultrasound, however after  discussing with the ultrasound tech her body habitus would significantly limit the efficacy of this test.  It is unlikely we will be able to see her ovaries, and utuerus would only be partially visualized for conversation.  Given this, I think it is reasonable to proceed with CT abdomen instead.  I do not have a high suspicion for PID, additionally patient denies any vagiinal symptoms.  The pain does not seem consistent with an ovarian torsion presentation, and patient is not pregnant so no ectopic.   CT abdomen pelvis resulted with findings consistent with fatty liver, additionally findings were notable for enlarged uterus multiple fibroids including a large fundal fibroid measuring 9.1.  IUD was visualized in stable position.  CT does not explain her abdominal pain, overall reassuring against any acute process like diverticulitis, colitis, appendicitis.  No evidence of pancreatitis either.  I suspect the fatty liver secondary to uncontrolled diabetes.  I discussed this with the patient, she states she has been off of her diabetes medicine for over a month secondary to not having any refills.  She does have a primary care doctor she last saw in February, she supposed be taking lisinopril 10 mg and glipizide 5 mg extended release.  I will send refills of both those medicines for 1 month to encourage her to follow-up with the PCP.         Final Clinical Impression(s) / ED Diagnoses Final diagnoses:  None    Rx / DC Orders ED Discharge Orders     None         Sherrill Raring, PA-C 10/29/21 [place], [place], DO 10/29/21 2244

## 2021-10-29 NOTE — ED Notes (Signed)
Patient transported to CT 

## 2021-10-29 NOTE — Discharge Instructions (Signed)
1 (please follow-up with your primary care doctor regarding medicine refills.  I have sent refills of the glipizide and lisinopril, please continue taking them as prescribed. Reach out to gynecology, if you do not have 1 information provided above.  Call them to set up an appointment regarding the fibroid in your uterus with the recommendations as well as for IUD extraction.

## 2021-10-30 ENCOUNTER — Telehealth: Payer: Self-pay

## 2021-10-30 NOTE — Telephone Encounter (Signed)
RNCM placed follow up call to patient regarding PCP and medication refill needs. This RNCM spoke with patient who advised she has medical insurance coverage with [employer] to cover her medications (glipizide 5 mg and lisinopril 10 mg) and has made a PCP appointment for ED follow up. No current needs at this time.

## 2021-11-09 ENCOUNTER — Other Ambulatory Visit: Payer: Self-pay

## 2021-11-09 ENCOUNTER — Encounter: Payer: Self-pay | Admitting: Nurse Practitioner

## 2021-11-09 ENCOUNTER — Ambulatory Visit (INDEPENDENT_AMBULATORY_CARE_PROVIDER_SITE_OTHER): Payer: 59 | Admitting: Nurse Practitioner

## 2021-11-09 VITALS — BP 138/84 | Ht 69.0 in | Wt 327.0 lb

## 2021-11-09 DIAGNOSIS — T8332XD Displacement of intrauterine contraceptive device, subsequent encounter: Secondary | ICD-10-CM

## 2021-11-09 DIAGNOSIS — D259 Leiomyoma of uterus, unspecified: Secondary | ICD-10-CM | POA: Diagnosis not present

## 2021-11-09 DIAGNOSIS — R102 Pelvic and perineal pain: Secondary | ICD-10-CM

## 2021-11-09 NOTE — Progress Notes (Signed)
° °  Acute Office Visit  Subjective:    Patient ID: Brittany Oneill, female    DOB: 12/27/1985, 36 y.o.   MRN: [id number]   HPI 36 y.o. [id number] presents today as new patient for lower abdominal pain and to discuss IUD. Was seen in ER 10/29/2021 with complaints of lower abdominal pain associated with nausea and vomiting. CT abdomen/pelvis at that time showed enlarged uterus with fibroid measuring 9.1 cm (no change since CT scan 04/2021), fatty liver, otherwise unremarkable. She is still experiences pain that she describes as pressure with occasional sharp pains, worse in left and with sitting. Feels like it is pushing down into vagina when sitting.   She reports that she [facility] for [facility]UD removal October 2022 but strings were not visible and was told IUD was no longer there, so she has been hoping to become pregnant. IUD in correct position on CT scan 10/29/2020. Mirena IUD placed 2020. She is having monthly cycles that are light to moderate and last 2-3 days. She wants IUD removed with plans to try to conceive. History of 6 SAB, 1 IAB. Has 53 yo daughter. History of diabetes, HTN.   LMP 10/20/2021  Review of Systems  Constitutional: Negative.   Gastrointestinal:  Positive for abdominal pain (lower L>R). Negative for constipation, diarrhea, nausea and vomiting.  Genitourinary:  Positive for pelvic pain. Negative for dyspareunia, menstrual problem and vaginal bleeding.      Objective:    Physical Exam Constitutional:      Appearance: Normal appearance.  Genitourinary:    General: Normal vulva.     Vagina: Normal.     Cervix: Normal.     Uterus: Enlarged and tender.      Comments: IUD string not visualized   BP 138/84    Ht 5\' 9"  (1.753 m)    Wt (!) 327 lb (148.3 kg)    LMP 10/20/2021    BMI 48.29 kg/m  Wt Readings from Last 3 Encounters:  11/09/21 (!) 327 lb (148.3 kg)  10/29/21 (!) 359 lb 5.6 oz (163 kg)  05/21/21 (!) 360 lb (163.3 kg)        Assessment &  Plan:   Problem List Items Addressed This Visit   None Visit Diagnoses     Pelvic pain    -  Primary   Uterine leiomyoma, unspecified location       Intrauterine contraceptive device threads lost, subsequent encounter       Relevant Orders   US PELVIS TRANSVAGINAL NON-OB (TV ONLY)      Plan: Will schedule ultrasound-guided IUD removal per request due to plans to try to conceive. We did discuss that depending on size and location fibroids can affect fertility. She also has significant history of miscarriages. She is aware that menstrual cycles may become worse after removal. Recommend she start tracking cycles. She is agreeable to plan and all questions answered.      Tamela Gammon DNP, 4:51 PM 11/09/2021

## 2021-12-13 NOTE — Progress Notes (Signed)
GYNECOLOGY  VISIT   HPI: 36 y.o.   Single  African American  female   [phone] with Patient's last menstrual period was 12/12/2021 (exact date).   here for pelvic ultrasound and IUD removal.  Desires IUD removal.  Desires pregnancy. She was seen in office on 11/09/21 and her IUD strings were not visible.   She was seen in the emergency department on 10/29/21 for abdominal pain. CT scan documented 9 cm fibroid and IUD in her uterus.   Has diabetes.  Glucose 255 at another ER visit on 12/08/21 which was for also for abdominal pain.  A1C is about 9 per patient.  She states it was 14.  She is on her last prescription for Glipizide and is hoping to continue care with her PCP.  She is awaiting a call from [facility] to see if she can continue there with her current insurance.   GYNECOLOGIC HISTORY: Patient's last menstrual period was 12/12/2021 (exact date). Contraception:  Mirena IUD 2020 Menopausal hormone therapy:  n/a Last mammogram:  n/a Last pap smear:  2022 [facility]  Always normal per patient.        OB History     Gravida  7   Para  1   Term  1   Preterm      AB  6   Living  1      SAB  5   IAB  1   Ectopic      Multiple      Live Births                 There are no problems to display for this patient.   Past Medical History:  Diagnosis Date   Arthritis    Asthma    Back pain    Diabetes mellitus without complication ([place])    Migraines    Scoliosis     Past Surgical History:  Procedure Laterality Date   CESAREAN SECTION     CHOLECYSTECTOMY      Current Outpatient Medications  Medication Sig Dispense Refill   albuterol (PROVENTIL HFA;VENTOLIN HFA) 108 (90 Base) MCG/ACT inhaler Inhale 1-2 puffs into the lungs every 6 (six) hours as needed for wheezing or shortness of breath.     EPINEPHrine (EPIPEN 2-PAK) 0.3 mg/0.3 mL IJ SOAJ injection Inject 0.3 mLs (0.3 mg total) into the muscle once as needed (for  severe allergic reaction). CAll 911 immediately if you have to use this medicine 1 Device 1   glipiZIDE (GLUCOTROL) 5 MG tablet Take 1 tablet (5 mg total) by mouth daily before breakfast. 30 tablet 0   hydrOXYzine (VISTARIL) 25 MG capsule Take 1 capsule (25 mg total) by mouth 3 (three) times daily as needed. 30 capsule 0   ibuprofen (ADVIL) 800 MG tablet Take by mouth.     levonorgestrel (MIRENA) 20 MCG/24HR IUD 1 each by Intrauterine route once.     lisinopril (ZESTRIL) 10 MG tablet Take 1 tablet (10 mg total) by mouth daily. 30 tablet 0   ondansetron (ZOFRAN ODT) 4 MG disintegrating tablet Take 1 tablet (4 mg total) by mouth every 8 (eight) hours as needed for nausea or vomiting. 20 tablet 0   No current facility-administered medications for this visit.     ALLERGIES: Penicillins, Metformin, Bee venom, and Sulfa antibiotics  Family History  Problem Relation Age of Onset   Hypertension Mother    Hypertension Father    Diabetes Father  Social History   Socioeconomic History   Marital status: Single    Spouse name: Not on file   Number of children: Not on file   Years of education: Not on file   Highest education level: Not on file  Occupational History   Not on file  Tobacco Use   Smoking status: Never   Smokeless tobacco: Never  Vaping Use   Vaping Use: Never used  Substance and Sexual Activity   Alcohol use: Yes    Comment: social   Drug use: No   Sexual activity: Yes    Birth control/protection: I.U.D.  Other Topics Concern   Not on file  Social History Narrative   Not on file   Social Determinants of Health   Financial Resource Strain: Not on file  Food Insecurity: Not on file  Transportation Needs: Not on file  Physical Activity: Not on file  Stress: Not on file  Social Connections: Not on file  Intimate Partner Violence: Not on file    Review of Systems  All other systems reviewed and are negative.  PHYSICAL EXAMINATION:    BP 140/82    Pulse 62     Ht 5\' 9"  (1.753 m)    Wt (!) 327 lb (148.3 kg)    LMP 12/12/2021 (Exact Date)    SpO2 98%    BMI 48.29 kg/m     General appearance: alert, cooperative and appears stated age  Pelvic: External genitalia:  no lesions              Urethra:  normal appearing urethra with no masses, tenderness or lesions              Bartholins and Skenes: normal                 Vagina: normal appearing vagina with normal color and discharge, no lesions              Cervix: no lesions                Bimanual Exam:  Uterus: size is difficult to palpate.              Adnexa: no mass, fullness, tenderness   Pelvic US Uterus 14.83 x 8.99 x 10.68 cm. 2 fibroids noted:  5.13 cm and 7.73 cm, the largest of which appears to be potentially encroaching on the endometrium.  EMS not measured.  IUD in endometrial canal with both arms extended.  Ovaries normal.  Normal perfusion.  No adnexal masses.  No free fluid.   Attempted IUD removal.  Consent for procedure done.  This included but was not limited to discussion of increased risk of having a child with birth defects due to poorly controlled diabetes.  Hibiclens prep.  Dressing forceps, narrow ring forceps, and IUD grasper used to attempt to remove the IUD, and this was not successful.  Procedure abandoned.  No complications.  Minimal EBL.  ASSESSMENT  Retained IUD.  Large uterine fibroids.  Desire for future pregnancy.  Uncontrolled diabetes.  Advanced maternal age.  BMI 48.29.   PLAN  We discussed removal of the IUD will cause menstrual period to occur and that pregnancy prevention will no long be present.  We discussed increased risk of birth defects with conceiving pregnancy while having poorly controlled diabetes.  Fibroid effect on uterine cavity and potential abnormal implantation issues and increased risk of miscarriage reviewed.   She wishes to proceed with hysteroscopic IUD removal in an  outpatient surgical setting.  Risks, benefits, and  alternatives reviewed. Risks include but are not limited to bleeding, infection, damage to surrounding organs including uterine perforation requiring hospitalization and laparoscopy, pulmonary edema, reaction to anesthesia, DVT, PE, death, need for further treatment and surgery. Surgical expectations and recovery discussed.   She declined attempted IUD removal in the office under ultrasound guidance.   40 min total time was spent for this patient encounter, including preparation, face-to-face counseling with the patient, coordination of care, and documentation of the encounter.   An After Visit Summary was printed and given to the patient.

## 2021-12-14 ENCOUNTER — Encounter: Payer: Self-pay | Admitting: Obstetrics and Gynecology

## 2021-12-14 ENCOUNTER — Ambulatory Visit (INDEPENDENT_AMBULATORY_CARE_PROVIDER_SITE_OTHER): Payer: 59 | Admitting: Obstetrics and Gynecology

## 2021-12-14 ENCOUNTER — Other Ambulatory Visit: Payer: Self-pay

## 2021-12-14 ENCOUNTER — Other Ambulatory Visit: Payer: Self-pay | Admitting: Nurse Practitioner

## 2021-12-14 ENCOUNTER — Ambulatory Visit (INDEPENDENT_AMBULATORY_CARE_PROVIDER_SITE_OTHER): Payer: 59

## 2021-12-14 VITALS — BP 140/82 | HR 62 | Ht 69.0 in | Wt 327.0 lb

## 2021-12-14 DIAGNOSIS — T8339XA Other mechanical complication of intrauterine contraceptive device, initial encounter: Secondary | ICD-10-CM | POA: Diagnosis not present

## 2021-12-14 DIAGNOSIS — T8332XD Displacement of intrauterine contraceptive device, subsequent encounter: Secondary | ICD-10-CM | POA: Diagnosis not present

## 2021-12-14 DIAGNOSIS — D251 Intramural leiomyoma of uterus: Secondary | ICD-10-CM | POA: Diagnosis not present

## 2021-12-16 ENCOUNTER — Telehealth: Payer: Self-pay | Admitting: Obstetrics and Gynecology

## 2021-12-16 NOTE — Telephone Encounter (Signed)
Please precert and schedule hysteroscopy with IUD removal in out patient surgical setting.   Patient has a retained IUD and uterine fibroids.   IUD removal was not successful in the office setting.   Location for the procedure: - [facility] is preferred if possible.   Time needed:  45 minutes.

## 2021-12-18 NOTE — Telephone Encounter (Signed)
Call to patient. Patient states unable to talk at this time, request return call in 1 hour.

## 2021-12-18 NOTE — Telephone Encounter (Signed)
Spoke with patient. Reviewed surgery dates. Patient request to proceed with surgery on 01/02/22.  Advised patient I will forward to business office for return call. I will return call once surgery date and time confirmed. Patient verbalizes understanding and is agreeable.   Surgery request sent.

## 2021-12-19 NOTE — Telephone Encounter (Signed)
Left message to call Maleek Craver, RN at [facility], [phone].  

## 2021-12-20 NOTE — Telephone Encounter (Signed)
Spoke with patient. Patient driving, will return call at later date to review pre-op instructions.  ?

## 2021-12-20 NOTE — Telephone Encounter (Signed)
Spoke with patient regarding surgery benefits. Patient acknowledges understanding of information presented. Patient is aware that benefits presented are professional benefits only. Patient is aware the hospital will call with facility benefits. See account note.  Routing to Jill Hamm, RN.  

## 2021-12-25 NOTE — Telephone Encounter (Signed)
Spoke with patient. Surgery date request confirmed.  ?Advised surgery is scheduled for 01/02/22, [place] at 62. ?Surgery instruction sheet and hospital brochure reviewed, printed copy will be mailed. Patient advised if Covid screening and quarantine requirements and agreeable.  ? ?Routing to provider. Encounter closed.  ?Cc: Hayley Carder ?

## 2021-12-27 ENCOUNTER — Other Ambulatory Visit: Payer: Self-pay

## 2021-12-27 ENCOUNTER — Encounter (HOSPITAL_BASED_OUTPATIENT_CLINIC_OR_DEPARTMENT_OTHER): Payer: Self-pay | Admitting: Obstetrics and Gynecology

## 2021-12-27 NOTE — Progress Notes (Addendum)
Spoke w/ via phone for pre-op interview: patient ?Lab needs dos: UPT (no surgeon orders yet) ?Lab results: CMP and CBCD 12/08/21; EKG 07/19/21- see paper chart ?COVID test: patient states asymptomatic no test needed. ?Arrive at 5:30 AM ?NPO after MN except clear liquids.Clear liquids from MN until 4:30AM ?Med rec completed. ?Medications to take morning of surgery: Albuterol inhaler PRN- bring DOS; patient stopping naproxen and ibuprofen per surgeon orders ?Diabetic medication: none DOS ?Patient instructed no nail polish to be worn day of surgery. ?Patient instructed to bring photo id and insurance card day of surgery. ?Patient aware to have Driver (ride ) / caregiver for 24 hours after surgery. ?Patient Special Instructions: Weight check appointment 12/29/21 at [facility]. ?Pre-Op special Instructions: NA ?Patient verbalized understanding of instructions that were given at this phone interview. ?Patient denies shortness of breath, chest pain, fever, cough at this phone interview.  ?

## 2021-12-29 ENCOUNTER — Other Ambulatory Visit: Payer: Self-pay

## 2021-12-29 ENCOUNTER — Encounter (HOSPITAL_COMMUNITY)
Admission: RE | Admit: 2021-12-29 | Discharge: 2021-12-29 | Disposition: A | Discharge status: Home / Self Care | Payer: 59 | Source: Ambulatory Visit | Attending: Obstetrics and Gynecology | Admitting: Obstetrics and Gynecology

## 2021-12-29 NOTE — Progress Notes (Signed)
Pt came in today for appointment made for weight/ height check prior to surgery on 01-02-2022 per anesthesia guidelines for ambulatory surgery.  Ht 5'9' ,  Wt 154.2 kg  = BMI 49.47.  pt is ok to proceed at surgery center. ?

## 2022-01-01 NOTE — Anesthesia Preprocedure Evaluation (Addendum)
Anesthesia Evaluation  Patient identified by MRN, date of birth, ID band Patient awake    Reviewed: Allergy & Precautions, NPO status , Patient's Chart, lab work & pertinent test results  Airway Mallampati: III       Dental no notable dental hx.    Pulmonary asthma ,    Pulmonary exam normal        Cardiovascular hypertension, Pt. on medications Normal cardiovascular exam     Neuro/Psych  Headaches, negative psych ROS   GI/Hepatic negative GI ROS, Neg liver ROS,   Endo/Other  diabetes, Type 2, Oral Hypoglycemic AgentsMorbid obesity  Renal/GU negative Renal ROS  negative genitourinary   Musculoskeletal   Abdominal (+) + obese,   Peds  Hematology   Anesthesia Other Findings   Reproductive/Obstetrics negative OB ROS                           Anesthesia Physical Anesthesia Plan  ASA: 3  Anesthesia Plan: General   Post-op Pain Management: Dilaudid IV   Induction: Intravenous  PONV Risk Score and Plan: 4 or greater and Ondansetron and Midazolam  Airway Management Planned: LMA  Additional Equipment:   Intra-op Plan:   Post-operative Plan: Extubation in OR  Informed Consent: I have reviewed the patients History and Physical, chart, labs and discussed the procedure including the risks, benefits and alternatives for the proposed anesthesia with the patient or authorized representative who has indicated his/her understanding and acceptance.     Dental advisory given  Plan Discussed with: CRNA  Anesthesia Plan Comments:        Anesthesia Quick Evaluation

## 2022-01-01 NOTE — H&P (Signed)
Office Visit ?12/14/2021 ?[facility] ?Nunzio Cobbs, MD ?Obstetrics and Gynecology Retained intrauterine contraceptive device (IUD) +1 more ?Dx Procedure ; Referred by [place] ?Reason for Visit  ? ?Additional Documentation ? ?Vitals:  BP 140/82 ?Pulse 62 ?Ht '5\' 9"'$  (1.753 m) ?Wt 148.3 kg Important   ?LMP 12/12/2021 (Exact Date) ?SpO2 98% ?BMI 48.29 kg/m? ?BSA 2.69 m?  ?Flowsheets:  NEWS, ?MEWS Score, ?Anthropometrics, ?Method of Visit ?  ?Encounter Info:  Billing Info, ?History, ?Allergies, ?Detailed Report ?  ? ?All Notes ? ? ? Progress Notes by Nunzio Cobbs, MD at 12/14/2021 9:00 AM ? ?Author: Nunzio Cobbs, MD Author Type: Physician Filed: 12/16/2021  2:17 PM  ?Note Status: Signed Cosign: Cosign Not Required Encounter Date: 12/14/2021  ?Editor: Nunzio Cobbs, MD (Physician)      ?Prior Versions: 1. Lowella Fairy, CMA (Certified Medical Assistant) at 12/14/2021  9:10 AM - Sign when Signing Visit  ? 2. Nunzio Cobbs, MD (Physician) at 12/14/2021  9:07 AM - Sign when Signing Visit  ? 3. Lowella Fairy, CMA (Certified Medical Assistant) at 12/14/2021  8:57 AM - Incomplete  ? 4. Nunzio Cobbs, MD (Physician) at 12/14/2021  8:26 AM - Sign when Signing Visit  ? 5. Lowella Fairy, CMA (Certified Psychologist, sport and exercise) at 12/13/2021  2:47 PM - Sign when Signing Visit  ?  ?GYNECOLOGY  VISIT ?  ?HPI: ?36 y.o.   Single  African American  female   ?[id number] with Patient's last menstrual period was 12/12/2021 (exact date).   ?here for pelvic ultrasound and IUD removal. ?  ?Desires IUD removal.  ?Desires pregnancy. ?She was seen in office on 11/09/21 and her IUD strings were not visible.  ?  ?She was seen in the emergency department on 10/29/21 for abdominal pain. ?CT scan documented 9 cm fibroid and IUD in her uterus.  ?  ?Has diabetes.  ?Glucose 255 at another ER visit on 12/08/21 which was for also for abdominal pain.  ?A1C is  about 9 per patient.  ?She states it was 14.  ?She is on her last prescription for Glipizide and is hoping to continue care with her PCP.  ?She is awaiting a call from [facility] to see if she can continue there with her current insurance.  ?  ?GYNECOLOGIC HISTORY: ?Patient's last menstrual period was 12/12/2021 (exact date). ?Contraception:  Mirena IUD 2020 ?Menopausal hormone therapy:  n/a ?Last mammogram:  n/a ?Last [facility]:  [facility]  Always normal per patient. ?       ?OB History   ?  ?  Gravida  ?7  ? Para  ?1  ? Term  ?1  ? Preterm  ?   ? AB  ?6  ? Living  ?1  ?  ?  ?  SAB  ?5  ? IAB  ?1  ? Ectopic  ?   ? Multiple  ?   ? Live Births  ?   ?    ?  ?   ?    ?  ?There are no problems to display for this patient. ?  ?  ?    ?Past Medical History:  ?Diagnosis Date  ? Arthritis    ? Asthma    ? Back pain    ? Diabetes mellitus without complication ([place])    ? Migraines    ?  Scoliosis    ?  ?  ?     ?Past Surgical History:  ?Procedure Laterality Date  ? CESAREAN SECTION      ? CHOLECYSTECTOMY      ?  ?  ?      ?Current Outpatient Medications  ?Medication Sig Dispense Refill  ? albuterol (PROVENTIL HFA;VENTOLIN HFA) 108 (90 Base) MCG/ACT inhaler Inhale 1-2 puffs into the lungs every 6 (six) hours as needed for wheezing or shortness of breath.      ? EPINEPHrine (EPIPEN 2-PAK) 0.3 mg/0.3 mL IJ SOAJ injection Inject 0.3 mLs (0.3 mg total) into the muscle once as needed (for severe allergic reaction). CAll 911 immediately if you have to use this medicine 1 Device 1  ? glipiZIDE (GLUCOTROL) 5 MG tablet Take 1 tablet (5 mg total) by mouth daily before breakfast. 30 tablet 0  ? hydrOXYzine (VISTARIL) 25 MG capsule Take 1 capsule (25 mg total) by mouth 3 (three) times daily as needed. 30 capsule 0  ? ibuprofen (ADVIL) 800 MG tablet Take by mouth.      ? levonorgestrel (MIRENA) 20 MCG/24HR IUD 1 each by Intrauterine route once.      ? lisinopril (ZESTRIL) 10 MG tablet Take 1 tablet  (10 mg total) by mouth daily. 30 tablet 0  ? ondansetron (ZOFRAN ODT) 4 MG disintegrating tablet Take 1 tablet (4 mg total) by mouth every 8 (eight) hours as needed for nausea or vomiting. 20 tablet 0  ?  ?No current facility-administered medications for this visit.  ?  ?  ?ALLERGIES: Penicillins, Metformin, Bee venom, and Sulfa antibiotics ?  ?     ?Family History  ?Problem Relation Age of Onset  ? Hypertension Mother    ? Hypertension Father    ? Diabetes Father    ?  ?  ?Social History  ?  ?     ?Socioeconomic History  ? Marital status: Single  ?    Spouse name: Not on file  ? Number of children: Not on file  ? Years of education: Not on file  ? Highest education level: Not on file  ?Occupational History  ? Not on file  ?Tobacco Use  ? Smoking status: Never  ? Smokeless tobacco: Never  ?Vaping Use  ? Vaping Use: Never used  ?Substance and Sexual Activity  ? Alcohol use: Yes  ?    Comment: social  ? Drug use: No  ? Sexual activity: Yes  ?    Birth control/protection: I.U.D.  ?Other Topics Concern  ? Not on file  ?Social History Narrative  ? Not on file  ?  ?Social Determinants of Health  ?  ?Financial Resource Strain: Not on file  ?Food Insecurity: Not on file  ?Transportation Needs: Not on file  ?Physical Activity: Not on file  ?Stress: Not on file  ?Social Connections: Not on file  ?Intimate Partner Violence: Not on file  ?  ?  ?Review of Systems  ?All other systems reviewed and are negative. ?  ?PHYSICAL EXAMINATION:   ?  ?BP 140/82   Pulse 62   Ht '5\' 9"'$  (1.753 m)   Wt (!) 327 lb (148.3 kg)   LMP 12/12/2021 (Exact Date)   SpO2 98%   BMI 48.29 kg/m?     ?General appearance: alert, cooperative and appears stated age ?  ?Pelvic: External genitalia:  no lesions ?             Urethra:  normal appearing urethra with no  masses, tenderness or lesions ?             Bartholins and Skenes: normal    ?             Vagina: normal appearing vagina with normal color and discharge, no lesions ?             Cervix: no  lesions ?               ?Bimanual Exam:  Uterus: size is difficult to palpate. ?             Adnexa: no mass, fullness, tenderness ?  ?Pelvic US ?Uterus 14.83 x 8.99 x 10.68 cm. ?2 fibroids noted:  5.13 cm and 7.73 cm, the largest of which appears to be potentially encroaching on the endometrium.  ?EMS not measured.  ?IUD in endometrial canal with both arms extended.  ?Ovaries normal.  Normal perfusion.  ?No adnexal masses.  ?No free fluid.  ?  ?Attempted IUD removal.  ?Consent for procedure done.  This included but was not limited to discussion of increased risk of having a child with birth defects due to poorly controlled diabetes.  ?Hibiclens prep.  ?Dressing forceps, narrow ring forceps, and IUD grasper used to attempt to remove the IUD, and this was not successful.  ?Procedure abandoned.  ?No complications.  ?Minimal EBL. ?  ?ASSESSMENT ?  ?Retained IUD.  ?Large uterine fibroids.  ?Desire for future pregnancy.  ?Uncontrolled diabetes.  ?Advanced maternal age.  ?BMI 48.29.  ?  ?PLAN ?  ?We discussed removal of the IUD will cause menstrual period to occur and that pregnancy prevention will no long be present.  ?We discussed increased risk of birth defects with conceiving pregnancy while having poorly controlled diabetes.  ?Fibroid effect on uterine cavity and potential abnormal implantation issues and increased risk of miscarriage reviewed.  ?  ?She wishes to proceed with hysteroscopic IUD removal in an outpatient surgical setting.  ?Risks, benefits, and alternatives reviewed. ?Risks include but are not limited to bleeding, infection, damage to surrounding organs including uterine perforation requiring hospitalization and laparoscopy, pulmonary edema, reaction to anesthesia, DVT, PE, death, need for further treatment and surgery. ?Surgical expectations and recovery discussed.  ?  ?She declined attempted IUD removal in the office under ultrasound guidance.  ?  ?40 min total time was spent for this patient  encounter, including preparation, face-to-face counseling with the patient, coordination of care, and documentation of the encounter. ?  ?An After Visit Summary was printed and given to the patient. ?  ?  ?  ?  ? ? ?

## 2022-01-02 ENCOUNTER — Ambulatory Visit (HOSPITAL_BASED_OUTPATIENT_CLINIC_OR_DEPARTMENT_OTHER): Payer: 59 | Admitting: Anesthesiology

## 2022-01-02 ENCOUNTER — Encounter (HOSPITAL_BASED_OUTPATIENT_CLINIC_OR_DEPARTMENT_OTHER): Payer: Self-pay | Admitting: Obstetrics and Gynecology

## 2022-01-02 ENCOUNTER — Ambulatory Visit (HOSPITAL_BASED_OUTPATIENT_CLINIC_OR_DEPARTMENT_OTHER): Payer: 59

## 2022-01-02 ENCOUNTER — Ambulatory Visit (HOSPITAL_BASED_OUTPATIENT_CLINIC_OR_DEPARTMENT_OTHER)
Admission: RE | Admit: 2022-01-02 | Discharge: 2022-01-02 | Disposition: A | Discharge status: Home / Self Care | Payer: 59 | Attending: Obstetrics and Gynecology | Admitting: Obstetrics and Gynecology

## 2022-01-02 ENCOUNTER — Encounter (HOSPITAL_BASED_OUTPATIENT_CLINIC_OR_DEPARTMENT_OTHER)
Admission: RE | Disposition: A | Discharge status: Home / Self Care | Payer: Self-pay | Source: Home / Self Care | Attending: Obstetrics and Gynecology

## 2022-01-02 ENCOUNTER — Other Ambulatory Visit: Payer: Self-pay

## 2022-01-02 DIAGNOSIS — Z30432 Encounter for removal of intrauterine contraceptive device: Secondary | ICD-10-CM | POA: Insufficient documentation

## 2022-01-02 DIAGNOSIS — M7989 Other specified soft tissue disorders: Secondary | ICD-10-CM

## 2022-01-02 DIAGNOSIS — T8339XA Other mechanical complication of intrauterine contraceptive device, initial encounter: Secondary | ICD-10-CM | POA: Diagnosis not present

## 2022-01-02 DIAGNOSIS — Z7984 Long term (current) use of oral hypoglycemic drugs: Secondary | ICD-10-CM | POA: Diagnosis not present

## 2022-01-02 DIAGNOSIS — D259 Leiomyoma of uterus, unspecified: Secondary | ICD-10-CM | POA: Diagnosis not present

## 2022-01-02 DIAGNOSIS — I10 Essential (primary) hypertension: Secondary | ICD-10-CM | POA: Insufficient documentation

## 2022-01-02 DIAGNOSIS — J45909 Unspecified asthma, uncomplicated: Secondary | ICD-10-CM | POA: Insufficient documentation

## 2022-01-02 DIAGNOSIS — D251 Intramural leiomyoma of uterus: Secondary | ICD-10-CM | POA: Diagnosis not present

## 2022-01-02 DIAGNOSIS — Z79899 Other long term (current) drug therapy: Secondary | ICD-10-CM | POA: Insufficient documentation

## 2022-01-02 DIAGNOSIS — Z6841 Body Mass Index (BMI) 40.0 and over, adult: Secondary | ICD-10-CM | POA: Insufficient documentation

## 2022-01-02 DIAGNOSIS — T8332XD Displacement of intrauterine contraceptive device, subsequent encounter: Secondary | ICD-10-CM

## 2022-01-02 DIAGNOSIS — E119 Type 2 diabetes mellitus without complications: Secondary | ICD-10-CM

## 2022-01-02 LAB — CBC
HCT: 43.3 % (ref 36.0–46.0)
Hemoglobin: 14.3 g/dL (ref 12.0–15.0)
MCH: 28.1 pg (ref 26.0–34.0)
MCHC: 33 g/dL (ref 30.0–36.0)
MCV: 85.1 fL (ref 80.0–100.0)
Platelets: 281 10*3/uL (ref 150–400)
RBC: 5.09 MIL/uL (ref 3.87–5.11)
RDW: 13 % (ref 11.5–15.5)
WBC: 6.9 10*3/uL (ref 4.0–10.5)
nRBC: 0 % (ref 0.0–0.2)

## 2022-01-02 LAB — POCT PREGNANCY, URINE: Preg Test, Ur: NEGATIVE

## 2022-01-02 LAB — GLUCOSE, CAPILLARY
Glucose-Capillary: 298 mg/dL — ABNORMAL HIGH (ref 70–99)
Glucose-Capillary: 275 mg/dL — ABNORMAL HIGH (ref 70–99)

## 2022-01-02 SURGERY — HYSTEROSCOPY
Anesthesia: General | Site: Vagina

## 2022-01-02 MED ORDER — LIDOCAINE HCL (PF) 2 % IJ SOLN
INTRAMUSCULAR | Status: AC
  Filled 2022-01-02: qty 5

## 2022-01-02 MED ORDER — GLYCOPYRROLATE 0.2 MG/ML IJ SOLN
INTRAMUSCULAR | Status: DC | PRN
  Administered 2022-01-02: .1 mg via INTRAVENOUS

## 2022-01-02 MED ORDER — POVIDONE-IODINE 10 % EX SWAB: 2.0000 "application " | Freq: Once | CUTANEOUS | Status: DC

## 2022-01-02 MED ORDER — LACTATED RINGERS IV SOLN: INTRAVENOUS | Status: DC

## 2022-01-02 MED ORDER — FENTANYL CITRATE (PF) 100 MCG/2ML IJ SOLN
INTRAMUSCULAR | Status: DC | PRN
  Administered 2022-01-02 (×2): 50 ug via INTRAVENOUS

## 2022-01-02 MED ORDER — OXYCODONE HCL 5 MG/5ML PO SOLN: 5.0000 mg | Freq: Once | ORAL | Status: DC | PRN

## 2022-01-02 MED ORDER — ACETAMINOPHEN 160 MG/5ML PO SOLN: 325.0000 mg | ORAL | Status: DC | PRN

## 2022-01-02 MED ORDER — FENTANYL CITRATE (PF) 100 MCG/2ML IJ SOLN
INTRAMUSCULAR | Status: AC
  Filled 2022-01-02: qty 2

## 2022-01-02 MED ORDER — ACETAMINOPHEN 325 MG PO TABS: 325.0000 mg | ORAL_TABLET | ORAL | Status: DC | PRN

## 2022-01-02 MED ORDER — MIDAZOLAM HCL 5 MG/5ML IJ SOLN
INTRAMUSCULAR | Status: DC | PRN
Start: 2022-01-02 — End: 2022-01-02
  Administered 2022-01-02: 2 mg via INTRAVENOUS

## 2022-01-02 MED ORDER — OXYCODONE HCL 5 MG PO TABS: 5.0000 mg | ORAL_TABLET | Freq: Once | ORAL | Status: DC | PRN

## 2022-01-02 MED ORDER — ONDANSETRON HCL 4 MG/2ML IJ SOLN
INTRAMUSCULAR | Status: DC | PRN
  Administered 2022-01-02: 4 mg via INTRAVENOUS

## 2022-01-02 MED ORDER — LIDOCAINE HCL (CARDIAC) PF 100 MG/5ML IV SOSY
PREFILLED_SYRINGE | INTRAVENOUS | Status: DC | PRN
  Administered 2022-01-02: 100 mg via INTRAVENOUS

## 2022-01-02 MED ORDER — MEPERIDINE HCL 25 MG/ML IJ SOLN: 6.2500 mg | INTRAMUSCULAR | Status: DC | PRN

## 2022-01-02 MED ORDER — PROPOFOL 10 MG/ML IV BOLUS
INTRAVENOUS | Status: DC | PRN
  Administered 2022-01-02: 200 mg via INTRAVENOUS

## 2022-01-02 MED ORDER — MIDAZOLAM HCL 2 MG/2ML IJ SOLN
INTRAMUSCULAR | Status: AC
  Filled 2022-01-02: qty 2

## 2022-01-02 MED ORDER — LIDOCAINE HCL 1 % IJ SOLN
INTRAMUSCULAR | Status: DC | PRN
  Administered 2022-01-02: 10 mL

## 2022-01-02 MED ORDER — PROPOFOL 10 MG/ML IV BOLUS
INTRAVENOUS | Status: AC
  Filled 2022-01-02: qty 20

## 2022-01-02 MED ORDER — ONDANSETRON HCL 4 MG/2ML IJ SOLN: 4.0000 mg | Freq: Once | INTRAMUSCULAR | Status: DC | PRN

## 2022-01-02 MED ORDER — SILVER NITRATE-POT NITRATE 75-25 % EX MISC
CUTANEOUS | Status: DC | PRN
  Administered 2022-01-02: 2

## 2022-01-02 MED ORDER — FENTANYL CITRATE (PF) 100 MCG/2ML IJ SOLN
25.0000 ug | INTRAMUSCULAR | Status: DC | PRN
  Administered 2022-01-02: 25 ug via INTRAVENOUS

## 2022-01-02 MED ORDER — ONDANSETRON HCL 4 MG/2ML IJ SOLN
INTRAMUSCULAR | Status: AC
  Filled 2022-01-02: qty 2

## 2022-01-02 MED ORDER — SODIUM CHLORIDE 0.9 % IR SOLN
Status: DC | PRN
  Administered 2022-01-02: 3000 mL

## 2022-01-02 SURGICAL SUPPLY — 17 items
CATH ROBINSON RED A/P 16FR (CATHETERS) ×2 IMPLANT
DRSG TELFA 3X8 NADH (GAUZE/BANDAGES/DRESSINGS) ×2 IMPLANT
GAUZE 4X4 16PLY ~~LOC~~+RFID DBL (SPONGE) ×4 IMPLANT
GLOVE SURG ENC MOIS LTX SZ6.5 (GLOVE) ×2 IMPLANT
GLOVE SURG POLYISO LF SZ8.5 (GLOVE) ×1 IMPLANT
GLOVE SURG UNDER POLY LF SZ6.5 (GLOVE) ×2 IMPLANT
GLOVE SURG UNDER POLY LF SZ8.5 (GLOVE) ×1 IMPLANT
GOWN STRL REUS W/TWL 2XL LVL3 (GOWN DISPOSABLE) ×1 IMPLANT
GOWN STRL REUS W/TWL LRG LVL3 (GOWN DISPOSABLE) ×3 IMPLANT
IV NS IRRIG 3000ML ARTHROMATIC (IV SOLUTION) ×2 IMPLANT
KIT PROCEDURE FLUENT (KITS) ×2 IMPLANT
KIT TURNOVER CYSTO (KITS) ×2 IMPLANT
PACK VAGINAL MINOR WOMEN LF (CUSTOM PROCEDURE TRAY) ×2 IMPLANT
PAD DRESSING TELFA 3X8 NADH (GAUZE/BANDAGES/DRESSINGS) ×1 IMPLANT
PAD OB MATERNITY 4.3X12.25 (PERSONAL CARE ITEMS) ×2 IMPLANT
SEAL ROD LENS SCOPE MYOSURE (ABLATOR) ×2 IMPLANT
TOWEL OR 17X26 10 PK STRL BLUE (TOWEL DISPOSABLE) ×2 IMPLANT

## 2022-01-02 NOTE — Anesthesia Procedure Notes (Signed)
Procedure Name: LMA Insertion ?Date/Time: 01/02/2022 7:45 AM ?Performed by: Georgeanne Nim, CRNA ?Pre-anesthesia Checklist: Patient identified, Patient being monitored, Emergency Drugs available, Timeout performed and Suction available ?Patient Re-evaluated:Patient Re-evaluated prior to induction ?Oxygen Delivery Method: Circle System Utilized ?Preoxygenation: Pre-oxygenation with 100% oxygen ?Induction Type: IV induction ?Ventilation: Mask ventilation without difficulty ?LMA: LMA inserted ?LMA Size: 4.0 ?Number of attempts: 1 ?Placement Confirmation: positive ETCO2, breath sounds checked- equal and bilateral and CO2 detector ? ? ? ? ?

## 2022-01-02 NOTE — Progress Notes (Signed)
Update to History and Physical ? ?S:  No marked change in status since office preop visit.  ?Nurse notes left lower extremity swelling and skin coloration change.  ?Patient states this is chronic and that she had evaluation at [facility] over 1 year ago.  ?Doppler negative for DVT in November, 2021 on chart review. ? ?Patient has appointment to see her PCP beginning of March.  ? ?O:   ?Vitals:  ? 01/02/22 0633  ?BP: (!) 154/95  ?Pulse: 67  ?Resp: 20  ?Temp: 99.1 ?F (37.3 ?C)  ?SpO2: 100%  ? ?Left LE with brownish coloration change and edema.  ? ?FSBG:  298 ? ?A:  Retained IUD.  ?     Chronic edema of LLE. ?     Diabetes mellitus. ? ?P:  Care discussed with Dr. Jillyn Hidden from anesthesia and patient ok to proceed with hysteroscopic removal of IUD. ?Patient will receive Glipizide now.  ?Will have evaluation of LLE after surgery is complete before patient leaves hospital today. ? ?

## 2022-01-02 NOTE — Anesthesia Postprocedure Evaluation (Signed)
Anesthesia Post Note ? ?Patient: Brittany Oneill ? ?Procedure(s) Performed: HYSTEROSCOPY (Vagina ) ?INTRAUTERINE DEVICE (IUD) REMOVAL (Vagina ) ? ?  ? ?Patient location during evaluation: PACU ?Anesthesia Type: General ?Level of consciousness: awake and sedated ?Pain management: pain level not controlled ?Vital Signs Assessment: post-procedure vital signs reviewed and stable ?Respiratory status: spontaneous breathing ?Cardiovascular status: stable ?Postop Assessment: no apparent nausea or vomiting ?Anesthetic complications: no ? ? ?No notable events documented. ? ?Last Vitals:  ?Vitals:  ? 01/02/22 0845 01/02/22 0900  ?BP: (!) 145/80 (!) 152/90  ?Pulse: 62 (!) 57  ?Resp: (!) 21 (!) 24  ?Temp:    ?SpO2: 98% 97%  ?  ?Last Pain:  ?Vitals:  ? 01/02/22 0900  ?TempSrc:   ?PainSc: 9   ? ? ?  ?  ?  ?  ?  ?  ? ?Huston Foley ? ? ? ? ?

## 2022-01-02 NOTE — Progress Notes (Signed)
Left lower extremity venous duplex has been completed. ?Preliminary results can be found in CV Proc through chart review.  ?Results were given to the patient's nurse, Tracie. ? ?01/02/22 8:59 AM ?Carlos Levering RVT   ?

## 2022-01-02 NOTE — Op Note (Signed)
OPERATIVE REPORT ? ?PREOPERATIVE DIAGNOSES:   Retained IUD, fibroids ? ?POSTOPERATIVE DIAGNOSES:   Retained IUD, fibroids ? ?PROCEDURE:  IUD removal, hysteroscopy ? ?SURGEON:  Lenard Galloway, MD ? ?ANESTHESIA:  LMA, paracervical block with 10 mL of 1% lidocaine. ? ?IV FLUIDS:  400 cc LR ? ?EBL:  5 cc ? ?URINE OUTPUT:  10 cc  ? ?NORMAL SALINE DEFICIT:   220 cc  ? ?COMPLICATIONS:  None. ? ?INDICATIONS FOR THE PROCEDURE:    ? ?The patient is a 36 year old [id number] African American female who presents with desire for IUD removal. The patient has lost IUD strings on examination and pelvic ultrasound documenting the IUD in a normal position in her uterus and documenting two fibroids 5.13 cm and 7.73 cm, the largest of which does encroach on the endometrium.  ? ?Attempted IUD removal in the office was not successful.  The patient declined ultrasound guided IUD removal in the office. A plan is now made to proceed with a hysteroscopic IUD removal after risks, benefits and alternatives were reviewed. ? ?FINDINGS:  Exam under anesthesia revealed a 14 week size mobile uterus.  No adnexal masses were noted.  ?Hysteroscopy showed a fundal fibroid which was encroaching into the endometrial cavity.  ?The right tubal ostial region could not be identified, and the left tubal ostia region appeared normal. ? ?SPECIMENS:   The IUD was removed and discarded. ? ?PROCEDURE IN DETAIL:  The patient was reidentified in the preoperative hold area.  She received TED hose and PAS stockings for DVT prophylaxis. ? ?In the operating room, the patient was placed in the dorsal lithotomy position and then an LMA anesthetic was introduced.  The patient's lower abdomen, vagina and perineum were sterilely prepped with Betadine and the patient's bladder was catheterized of urine.  She was sterilly draped. ? ?An exam under anesthesia was performed.  See the findings above. ? ?A speculum was placed inside the vagina and a single-tooth tenaculum was placed on  the anterior cervical lip.  A paracervical block was performed with a total of 10 mL of 1% lidocaine plain.    ? ?A curved ring forceps was used to reach inside the cervical canal and remove the IUD successfully.  ?The IUD was noted to be intact, and was later discarded.  ? ?The cervix was dilated to a #19 Pratt dilator.  The MyoSure hysteroscope was then inserted inside the uterine cavity under the continuous infusion of normal saline solution.   Findings are as noted above.    ?The MyoSure hysteroscope was removed. ?  ?The single-tooth tenaculum which had been placed on the anterior cervical lip was removed.  There was some bleeding of the right tenaculum site, and this was treated with silver nitrate. Hemostasis was then good, and all of the vaginal instruments were removed. ? ?The patient was awakened and escorted to the recovery room in stable condition after she was cleansed of Betadine.  There were no complications to the procedure.   ? ?All needle, instrument and sponge counts were correct. ? ?Lenard Galloway, MD ? ?

## 2022-01-02 NOTE — Discharge Instructions (Addendum)
[place],  ? ?I was able to remove the IUD without any problem.  ? ?When I looked inside the uterine cavity, I saw a fibroid slightly pushing into the uterine cavity at the top.  ? ?I was not able to see the opening of the right fallopian tube where it attaches to the uterus.  ? ?The opening of the left fallopian tube appeared normal where it attaches to the uterus.  ? ?Everything went well. ? ?Josefa Half, MD ? ?DISCHARGE INSTRUCTIONS: HYSTEROSCOPY  ?The following instructions have been prepared to help you care for yourself upon your return home. ? ?May take stool softner while taking narcotic pain medication to prevent constipation.  Drink plenty of water. ? ?Personal hygiene: ? Use sanitary pads for vaginal drainage, not tampons. ? Shower the day after your procedure. ? NO tub baths, pools or Jacuzzis for 2-3 weeks. ? Wipe front to back after using the bathroom. ? ?Activity and limitations: ? Do NOT drive or operate any equipment for 24 hours. The effects of anesthesia are still present ?and drowsiness may result. ? Do NOT rest in bed all day. ? Walking is encouraged. ? Walk up and down stairs slowly. ? You may resume your normal activity in one to two days or as indicated by your physician. ?Sexual activity: NO intercourse for at least 2 weeks after the procedure, or as indicated by your ?Doctor. ? ?Diet: Eat a light meal as desired this evening. You may resume your usual diet tomorrow. ? ?Return to Work: You may resume your work activities in one to two days or as indicated by your ?Doctor. ? ?What to expect after your surgery: Expect to have vaginal bleeding/discharge for 2-3 days and ?spotting for up to 10 days. It is not unusual to have soreness for up to 1-2 weeks. You may have a ?slight burning sensation when you urinate for the first day. Mild cramps may continue for a couple of ?days. You may have a regular period in 2-6 weeks. ? ?Call your doctor for any of the following: ? Excessive vaginal  bleeding or clotting, saturating and changing one pad every hour. ? Inability to urinate 6 hours after discharge from hospital. ? Pain not relieved by pain medication. ? Fever of 100.4? F or greater. ? Unusual vaginal discharge or odor. ? ? ?Post Anesthesia Home Care Instructions ? ?Activity: ?Get plenty of rest for the remainder of the day. A responsible individual must stay with you for 24 hours following the procedure.  ?For the next 24 hours, DO NOT: ?-Drive a car ?-Paediatric nurse ?-Drink alcoholic beverages ?-Take any medication unless instructed by your physician ?-Make any legal decisions or sign important papers. ? ?Meals: ?Start with liquid foods such as gelatin or soup. Progress to regular foods as tolerated. Avoid greasy, spicy, heavy foods. If nausea and/or vomiting occur, drink only clear liquids until the nausea and/or vomiting subsides. Call your physician if vomiting continues. ? ?Special Instructions/Symptoms: ?Your throat may feel dry or sore from the anesthesia or the breathing tube placed in your throat during surgery. If this causes discomfort, gargle with warm salt water. The discomfort should disappear within 24 hours. ? ? ?

## 2022-01-02 NOTE — Transfer of Care (Signed)
Immediate Anesthesia Transfer of Care Note ? ?Patient: Brittany Oneill ? ?Procedure(s) Performed: Procedure(s) (LRB): ?HYSTEROSCOPY (N/A) ?INTRAUTERINE DEVICE (IUD) REMOVAL (N/A) ? ?Patient Location: PACU ? ?Anesthesia Type: General ? ?Level of Consciousness: awake, sedated, patient cooperative and responds to stimulation ? ?Airway & Oxygen Therapy: Patient Spontanous Breathing and Patient connected to face mask oxygen ? ?Post-op Assessment: Report given to PACU RN, Post -op Vital signs reviewed and stable and Patient moving all extremities ? ?Post vital signs: Reviewed and stable ? ?Complications: No apparent anesthesia complications ?

## 2022-01-03 ENCOUNTER — Encounter (HOSPITAL_BASED_OUTPATIENT_CLINIC_OR_DEPARTMENT_OTHER): Payer: Self-pay | Admitting: Obstetrics and Gynecology

## 2022-01-05 ENCOUNTER — Telehealth: Payer: Self-pay

## 2022-01-05 DIAGNOSIS — R600 Localized edema: Secondary | ICD-10-CM

## 2022-01-05 NOTE — Telephone Encounter (Signed)
Per Dr. Quincy Simmonds ? ?"She has significant left lower extremity edema.  ?I recommend she see a specialist and would like to refer her to Dr. Sherren Mocha Early."  ? ?Referral placed.  ?

## 2022-01-08 NOTE — Progress Notes (Signed)
GYNECOLOGY  VISIT ?  ?HPI: ?36 y.o.   Single  African American  female   ?[id number] with No LMP recorded.   ?here for post op visit from hysteroscopy on 01/02/22. Reports bleeding ceased on 01/14/22.  ? ?Hysteroscopy was done to remove IUD due to lost IUD strings.  ?Patient has uterine fibroids and has a fundal fibroid encroaching on the endometrium.   ?The right tubal ostial opening was not seen and the left tubal ostia appeared normal.  ? ?Patient was noted to have significant swelling of her LLE the day of her surgery.  ?This is chronic in nature but had worsened, so she had a doppler US of the LLE to rule out DVT.  ?Her study was negative but was limited.  ? ?GYNECOLOGIC HISTORY: ?No LMP recorded. ?Contraception: none ?Menopausal hormone therapy: n/a ?Last mammogram: n/a ?Last pap smear: 2022-WNL per pt @ [facility] ?       ?OB History   ? ? Gravida  ?7  ? Para  ?1  ? Term  ?1  ? Preterm  ?   ? AB  ?6  ? Living  ?1  ?  ? ? SAB  ?5  ? IAB  ?1  ? Ectopic  ?   ? Multiple  ?   ? Live Births  ?   ?   ?  ?  ?    ? ?There are no problems to display for this patient. ? ? ?Past Medical History:  ?Diagnosis Date  ? Arthritis   ? Asthma   ? Back pain   ? Diabetes mellitus without complication ([place])   ? Migraines   ? Scoliosis   ? ? ?Past Surgical History:  ?Procedure Laterality Date  ? CESAREAN SECTION    ? CHOLECYSTECTOMY    ? FOOT SURGERY    ? HYSTEROSCOPY N/A 01/02/2022  ? Procedure: HYSTEROSCOPY;  Surgeon: Nunzio Cobbs, MD;  Location: [facility];  Service: Gynecology;  Laterality: N/A;  ? IUD REMOVAL N/A 01/02/2022  ? Procedure: INTRAUTERINE DEVICE (IUD) REMOVAL;  Surgeon: Nunzio Cobbs, MD;  Location: [facility];  Service: Gynecology;  Laterality: N/A;  ? ? ?Current Outpatient Medications  ?Medication Sig Dispense Refill  ? albuterol (PROVENTIL HFA;VENTOLIN HFA) 108 (90 Base) MCG/ACT inhaler Inhale 1-2 puffs into the lungs every 6 (six) hours as needed for wheezing or  shortness of breath.    ? EPINEPHrine (EPIPEN 2-PAK) 0.3 mg/0.3 mL IJ SOAJ injection Inject 0.3 mLs (0.3 mg total) into the muscle once as needed (for severe allergic reaction). CAll 911 immediately if you have to use this medicine 1 Device 1  ? glipiZIDE (GLUCOTROL) 5 MG tablet Take 1 tablet (5 mg total) by mouth daily before breakfast. 30 tablet 0  ? hydrOXYzine (VISTARIL) 25 MG capsule Take 1 capsule (25 mg total) by mouth 3 (three) times daily as needed. 30 capsule 0  ? ibuprofen (ADVIL) 800 MG tablet Take by mouth.    ? lisinopril (ZESTRIL) 10 MG tablet Take 1 tablet (10 mg total) by mouth daily. 30 tablet 0  ? ondansetron (ZOFRAN ODT) 4 MG disintegrating tablet Take 1 tablet (4 mg total) by mouth every 8 (eight) hours as needed for nausea or vomiting. 20 tablet 0  ? Prenatal Vit-Fe Fumarate-FA (PRENATAL MULTIVITAMIN) TABS tablet Take 1 tablet by mouth daily at 12 noon. Daily    ? ?No current facility-administered medications for this visit.  ?  ? ?ALLERGIES:  Penicillins, Metformin, Bee venom, and Sulfa antibiotics ? ?Family History  ?Problem Relation Age of Onset  ? Hypertension Mother   ? Hypertension Father   ? Diabetes Father   ? ? ?Social History  ? ?Socioeconomic History  ? Marital status: Single  ?  Spouse name: Not on file  ? Number of children: Not on file  ? Years of education: Not on file  ? Highest education level: Not on file  ?Occupational History  ? Not on file  ?Tobacco Use  ? Smoking status: Never  ? Smokeless tobacco: Never  ?Vaping Use  ? Vaping Use: Never used  ?Substance and Sexual Activity  ? Alcohol use: Yes  ?  Comment: social  ? Drug use: No  ? Sexual activity: Yes  ?  Birth control/protection: I.U.D.  ?Other Topics Concern  ? Not on file  ?Social History Narrative  ? Not on file  ? ?Social Determinants of Health  ? ?Financial Resource Strain: Not on file  ?Food Insecurity: Not on file  ?Transportation Needs: Not on file  ?Physical Activity: Not on file  ?Stress: Not on file  ?Social  Connections: Not on file  ?Intimate Partner Violence: Not on file  ? ? ?Review of Systems  See HPI. ? ?PHYSICAL EXAMINATION:   ? ?BP 128/82   Pulse 71   SpO2 98%     ?General appearance: alert, cooperative and appears stated age ?  ?Pelvic: External genitalia:  no lesions ?             Urethra:  normal appearing urethra with no masses, tenderness or lesions ?             Bartholins and Skenes: normal    ?             Vagina: normal appearing vagina with normal color and discharge, no lesions ?             Cervix: no lesions ?               ?Bimanual Exam:  Uterus:  14 week size, nontender. ?             Adnexa: no mass, fullness, tenderness ?  ?Chaperone was present for exam:  Lovena Le, CMA ? ?ASSESSMENT ? ?Status post hysteroscopic removal of IUD.  ?Fibroids. One encroaching on uterine cavity.  ?Possible blockage of right fallopian tube. ?DM.  Not well controlled.  ?LLE edema.  ? ?PLAN ? ?Surgical findings and procedure reviewed.  ?I dicussed hysterosalpingogram for definition of fibroid encroachment on uterine cavity and for determination fallopian tube is blocked.  I did educate that a fibroid may cause problems with implantation and retention of pregnancy.  ?She is aware she no longer has pregnancy prevention.  ?I recommended PNV.  ?She will see her PCP this spring for her diabetes care.  ?We have reviewed effect of elevated blood sugar on pregnancy. ?She has an appointment with a vascular specialist about her chronic LLE edema. ?FU prn.  ?  ?An After Visit Summary was printed and given to the patient. ? ? ? ?

## 2022-01-15 NOTE — Telephone Encounter (Signed)
Brittany Oneill with [facility] states they have just finished reviewing her referral and just need to contact pt to schedule.  ?

## 2022-01-17 NOTE — Telephone Encounter (Signed)
Encounter reviewed and closed.  

## 2022-01-17 NOTE — Telephone Encounter (Signed)
FYI. Scheduled for 02/20/22.  ?

## 2022-01-18 ENCOUNTER — Ambulatory Visit (INDEPENDENT_AMBULATORY_CARE_PROVIDER_SITE_OTHER): Payer: 59 | Admitting: Obstetrics and Gynecology

## 2022-01-18 ENCOUNTER — Encounter: Payer: Self-pay | Admitting: Obstetrics and Gynecology

## 2022-01-18 VITALS — BP 128/82 | HR 71

## 2022-01-18 DIAGNOSIS — D219 Benign neoplasm of connective and other soft tissue, unspecified: Secondary | ICD-10-CM

## 2022-01-18 DIAGNOSIS — Z9889 Other specified postprocedural states: Secondary | ICD-10-CM

## 2022-01-18 NOTE — Patient Instructions (Signed)
Hysterosalpingography ?Hysterosalpingography is a procedure to look inside a woman's womb (uterus) and fallopian tubes. During this procedure, contrast dye is injected into the uterus through the vagina and cervix. X-rays are then taken. The dye makes the uterus and fallopian tubes show up clearly on the X-rays and may show whether the tubes are blocked, scarred, or if there are any other abnormalities. ?This procedure may be done: ?To determine if there are tumors, scars, or other abnormalities in the uterus. ?To determine why a woman is unable to get pregnant or has had repeated pregnancy losses. ?To make sure the fallopian tubes are completely blocked a few months after having certain tubal sterilization procedures. ?Tell a health care provider about: ?Any allergies you have. ?All medicines you are taking, including vitamins, herbs, eye drops, creams, and over-the-counter medicines. ?Any problems you or family members have had with anesthetic medicines. ?Any blood disorders you have. ?Any surgeries you have had. ?Any medical conditions you have. ?Whether you are pregnant or may be pregnant. ?Whether you have been diagnosed with an STI (sexually transmitted infection) or you think you have an STI. ?What are the risks? ?Generally, this is a safe procedure. However, problems may occur, including: ?Infection in the lining of the uterus or fallopian tubes. ?Allergic reaction to medicines or dyes. ?A hole (perforation) in the uterus or fallopian tubes. ?Damage to other structures or organs. ?What happens before the procedure? ?Medicines ?Ask your health care provider about: ?Changing or stopping your regular medicines. This is especially important if you are taking diabetes medicines or blood thinners. ?Taking medicines such as aspirin and ibuprofen. These medicines can thin your blood. Do not take these medicines unless your health care provider tells you to take them. ?Taking over-the-counter medicines, vitamins,  herbs, and supplements. ?General instructions ?Schedule the procedure after your menstrual period stops, but before your next ovulation. This is usually between day 5 and day 10 of your last period. Day 1 is the first day of your period. ?Plan to have a responsible adult take you home from the hospital or clinic. ?Plan to have a responsible adult care for you for the time you are told after you leave the hospital or clinic. This is important. ?Empty your bladder before the procedure begins. ?What happens during the procedure? ?You may be given: ?A medicine to help you relax (sedative). ?A medicine to numb the area (local anesthetic). ?An over-the-counter pain medicine. ?You will lie down on your back and place your feet into footrests (stirrups). ?A device called a speculum will be inserted into your vagina. This allows your health care provider to see inside your vagina through to your cervix. ?Your cervix will be washed with a germ-killing soap. ?A medicine may be injected into your cervix to numb it. ?A thin, flexible tube will be passed through your cervix into your uterus. ?Contrast dye will be passed through the tube and into the uterus. This may cause cramping. ?Several X-rays will be taken as the contrast dye spreads through the uterus and into the fallopian tubes. ?You may be asked to your change position and roll from side to side if needed. ?The tube will be removed. The contrast dye will flow out through your vagina. ?The procedure may vary among health care providers and hospitals. ?What can I expect after procedure? ?You may have mild cramping and vaginal bleeding. This should go away after a short time. ?Most of the contrast dye will flow out of your vagina. This fluid will be  sticky and may have blood in it. You may want to wear a sanitary pad. Do not use a tampon. ?You have mild dizziness or nausea. ?Follow these instructions at home: ?Do not douche, use tampons, or have sex until your health care  provider approves. ?Do not take baths, swim, or use a hot tub until your health care provider approves. Take showers instead of baths for 2 weeks, or for as long as told by your health care provider. ?If you were given a sedative during the procedure, it can affect you for several hours. Do not drive or operate machinery until your health care provider says that it is safe. ?Take over-the-counter and prescription medicines only as told by your health care provider. ?It is up to you to get the results of your procedure. Ask your health care provider, or the department that is doing the procedure, when your results will be ready. ?Keep all follow-up visits. This is important. ?Contact a health care provider if: ?You have a fever or chills. ?You faint. ?You have severe cramping. ?Your skin has a rash, and is itchy or swollen. ?You have a bad-smelling vaginal discharge. ?You have vaginal bleeding that lasts for more than 4 days. ?Get help right away if: ?You have nausea and vomiting. ?You have heavy vaginal bleeding that soaks more than one pad every hour. ?You have severe abdominal pain. ?Summary ?Hysterosalpingography is a procedure in which a woman's uterus and fallopian tubes are examined. ?During this procedure, contrast dye is injected into the uterus through the vagina and cervix. X-rays are then taken. The dye helps the uterus and fallopian tubes show up clearly on the X-rays. ?Schedule the procedure after your menstrual period stops, but before your next ovulation. This is usually between day 5 and day 10 of your last period. ?After the procedure, you may have mild cramping and vaginal bleeding. This should go away after a short time. ?Do not douche, use tampons, or have sex until your health care provider approves. ?This information is not intended to replace advice given to you by your health care provider. Make sure you discuss any questions you have with your health care provider. ?Document Revised:  06/21/2021 Document Reviewed: 05/25/2020 ?Elsevier Patient Education ? [place]. ? ?

## 2022-02-09 ENCOUNTER — Other Ambulatory Visit: Payer: Self-pay

## 2022-02-09 DIAGNOSIS — I872 Venous insufficiency (chronic) (peripheral): Secondary | ICD-10-CM

## 2022-02-10 ENCOUNTER — Emergency Department (HOSPITAL_BASED_OUTPATIENT_CLINIC_OR_DEPARTMENT_OTHER)
Admission: EM | Admit: 2022-02-10 | Discharge: 2022-02-11 | Disposition: A | Discharge status: Home / Self Care | Payer: 59 | Attending: Emergency Medicine | Admitting: Emergency Medicine

## 2022-02-10 ENCOUNTER — Other Ambulatory Visit: Payer: Self-pay

## 2022-02-10 ENCOUNTER — Encounter (HOSPITAL_BASED_OUTPATIENT_CLINIC_OR_DEPARTMENT_OTHER): Payer: Self-pay | Admitting: Emergency Medicine

## 2022-02-10 DIAGNOSIS — E871 Hypo-osmolality and hyponatremia: Secondary | ICD-10-CM | POA: Insufficient documentation

## 2022-02-10 DIAGNOSIS — E119 Type 2 diabetes mellitus without complications: Secondary | ICD-10-CM | POA: Diagnosis not present

## 2022-02-10 DIAGNOSIS — J45909 Unspecified asthma, uncomplicated: Secondary | ICD-10-CM | POA: Insufficient documentation

## 2022-02-10 DIAGNOSIS — R197 Diarrhea, unspecified: Secondary | ICD-10-CM | POA: Insufficient documentation

## 2022-02-10 DIAGNOSIS — R112 Nausea with vomiting, unspecified: Secondary | ICD-10-CM | POA: Diagnosis present

## 2022-02-10 LAB — URINALYSIS, ROUTINE W REFLEX MICROSCOPIC
Bilirubin Urine: NEGATIVE
Glucose, UA: >=500 mg/dL — AB
Ketones, ur: NEGATIVE mg/dL
Leukocytes,Ua: NEGATIVE
Nitrite: NEGATIVE
Protein, ur: 30 mg/dL — AB
Specific Gravity, Urine: 1.02 (ref 1.005–1.030)
pH: 6 (ref 5.0–8.0)

## 2022-02-10 LAB — CBC WITH DIFFERENTIAL/PLATELET
Abs Immature Granulocytes: 0.01 10*3/uL (ref 0.00–0.07)
Basophils Absolute: 0.1 10*3/uL (ref 0.0–0.1)
Basophils Relative: 1 %
Eosinophils Absolute: 0.3 10*3/uL (ref 0.0–0.5)
Eosinophils Relative: 4 %
HCT: 38 % (ref 36.0–46.0)
Hemoglobin: 12.7 g/dL (ref 12.0–15.0)
Immature Granulocytes: 0 %
Lymphocytes Relative: 36 %
Lymphs Abs: 2.6 10*3/uL (ref 0.7–4.0)
MCH: 28.5 pg (ref 26.0–34.0)
MCHC: 33.4 g/dL (ref 30.0–36.0)
MCV: 85.2 fL (ref 80.0–100.0)
Monocytes Absolute: 0.6 10*3/uL (ref 0.1–1.0)
Monocytes Relative: 8 %
Neutro Abs: 3.7 10*3/uL (ref 1.7–7.7)
Neutrophils Relative %: 51 %
Platelets: 301 10*3/uL (ref 150–400)
RBC: 4.46 MIL/uL (ref 3.87–5.11)
RDW: 12.7 % (ref 11.5–15.5)
WBC: 7.2 10*3/uL (ref 4.0–10.5)
nRBC: 0 % (ref 0.0–0.2)

## 2022-02-10 LAB — COMPREHENSIVE METABOLIC PANEL
ALT: 27 U/L (ref 0–44)
AST: 18 U/L (ref 15–41)
Albumin: 3.5 g/dL (ref 3.5–5.0)
Alkaline Phosphatase: 65 U/L (ref 38–126)
Anion gap: 6 (ref 5–15)
BUN: 9 mg/dL (ref 6–20)
CO2: 26 mmol/L (ref 22–32)
Calcium: 8.6 mg/dL — ABNORMAL LOW (ref 8.9–10.3)
Chloride: 102 mmol/L (ref 98–111)
Creatinine, Ser: 0.78 mg/dL (ref 0.44–1.00)
GFR, Estimated: >60 mL/min (ref >60)
Glucose, Bld: 361 mg/dL — ABNORMAL HIGH (ref 70–99)
Potassium: 3.9 mmol/L (ref 3.5–5.1)
Sodium: 134 mmol/L — ABNORMAL LOW (ref 135–145)
Total Bilirubin: <0.1 mg/dL — ABNORMAL LOW (ref 0.3–1.2)
Total Protein: 7.3 g/dL (ref 6.5–8.1)

## 2022-02-10 LAB — URINALYSIS, MICROSCOPIC (REFLEX)

## 2022-02-10 LAB — LIPASE, BLOOD: Lipase: 41 U/L (ref 11–51)

## 2022-02-10 LAB — PREGNANCY, URINE: Preg Test, Ur: NEGATIVE

## 2022-02-10 MED ORDER — LACTATED RINGERS IV BOLUS
1000.0000 mL | Freq: Once | INTRAVENOUS | Status: AC
  Administered 2022-02-10: 1000 mL via INTRAVENOUS

## 2022-02-10 MED ORDER — ONDANSETRON HCL 4 MG/2ML IJ SOLN
4.0000 mg | Freq: Once | INTRAMUSCULAR | Status: AC
  Administered 2022-02-10: 4 mg via INTRAVENOUS
  Filled 2022-02-10: qty 2

## 2022-02-10 NOTE — ED Triage Notes (Signed)
Pt reports has nausea and vomiting after eating. Just had IUD removed on 14th of March. Been having irregular bleeding and unprotected sex since then. She has been trying to get pregnant.  ?

## 2022-02-10 NOTE — ED Provider Notes (Signed)
?[facility] ?Provider Note ? ? ?CSN: [id number] ?Arrival date & time: 02/10/22  2139 ? ?  ? ?History ? ?Chief Complaint  ?Patient presents with  ? Emesis  ? ? ?Brittany Oneill is a 36 y.o. female. ? ?The history is provided by the patient.  ?Emesis ?She has history of asthma, diabetes and comes in because of nausea, vomiting, diarrhea for the last 3 days.  She has been able to tolerate a small amount of oral fluids.  She has had 3-4 loose bowel movements today.  She denies fever, chills, sweats.  There has been some abdominal cramping but no true abdominal pain.  She denies any sick contacts.  She is questioning whether her symptoms could be related to having her IUD removed last month, or due to the presence of uterine fibroids. ?  ?Home Medications ?Prior to Admission medications   ?Medication Sig Start Date End Date Taking? Authorizing Provider  ?albuterol (PROVENTIL HFA;VENTOLIN HFA) 108 (90 Base) MCG/ACT inhaler Inhale 1-2 puffs into the lungs every 6 (six) hours as needed for wheezing or shortness of breath.    [provider]  ?EPINEPHrine (EPIPEN 2-PAK) 0.3 mg/0.3 mL IJ SOAJ injection Inject 0.3 mLs (0.3 mg total) into the muscle once as needed (for severe allergic reaction). CAll 911 immediately if you have to use this medicine 12/14/16   Larene Pickett, PA-C  ?glipiZIDE (GLUCOTROL) 5 MG tablet Take 1 tablet (5 mg total) by mouth daily before breakfast. 10/29/21   Sherrill Raring, PA-C  ?hydrOXYzine (VISTARIL) 25 MG capsule Take 1 capsule (25 mg total) by mouth 3 (three) times daily as needed. 12/17/16   Kirichenko, Lahoma Rocker, PA-C  ?ibuprofen (ADVIL) 800 MG tablet Take by mouth. 12/08/21   [provider]  ?lisinopril (ZESTRIL) 10 MG tablet Take 1 tablet (10 mg total) by mouth daily. 10/29/21   Sherrill Raring, PA-C  ?ondansetron (ZOFRAN ODT) 4 MG disintegrating tablet Take 1 tablet (4 mg total) by mouth every 8 (eight) hours as needed for nausea or vomiting. 05/22/21    Margarita Mail, PA-C  ?Prenatal Vit-Fe Fumarate-FA (PRENATAL MULTIVITAMIN) TABS tablet Take 1 tablet by mouth daily at 12 noon. Daily    [provider]  ?   ? ?Allergies    ?Penicillins, Metformin, Bee venom, and Sulfa antibiotics   ? ?Review of Systems   ?Review of Systems  ?Gastrointestinal:  Positive for vomiting.  ?All other systems reviewed and are negative. ? ?Physical Exam ?Updated Vital Signs ?BP (!) 154/110 (BP Location: Left Arm)   Pulse 66   Temp 98.2 ?F (36.8 ?C) (Oral)   Resp 18   SpO2 97%  ?Physical Exam ?Vitals and nursing note reviewed.  ?36 year old female, resting comfortably and in no acute distress. Vital signs are significant for elevated blood pressure. Oxygen saturation is 97%, which is normal. ?Head is normocephalic and atraumatic. PERRLA, EOMI. Oropharynx is clear. ?Neck is nontender and supple without adenopathy or JVD. ?Back is nontender and there is no CVA tenderness. ?Lungs are clear without rales, wheezes, or rhonchi. ?Chest is nontender. ?Heart has regular rate and rhythm without murmur. ?Abdomen is soft, flat, nontender.  Peristalsis is hypoactive. ?Extremities have no cyanosis or edema, full range of motion is present. ?Skin is warm and dry without rash. ?Neurologic: Mental status is normal, cranial nerves are intact, moves all extremities equally. ? ?ED Results / Procedures / Treatments   ?Labs ?(all labs ordered are listed, but only abnormal results are displayed) ?Labs  Reviewed  ?COMPREHENSIVE METABOLIC PANEL - Abnormal; Notable for the following components:  ?    Result Value  ? Sodium 134 (*)   ? Glucose, Bld 361 (*)   ? Calcium 8.6 (*)   ? Total Bilirubin <0.1 (*)   ? All other components within normal limits  ?URINALYSIS, ROUTINE W REFLEX MICROSCOPIC - Abnormal; Notable for the following components:  ? APPearance HAZY (*)   ? Glucose, UA >=500 (*)   ? Hgb urine dipstick SMALL (*)   ? Protein, ur 30 (*)   ? All other components within normal limits  ?URINALYSIS,  MICROSCOPIC (REFLEX) - Abnormal; Notable for the following components:  ? Bacteria, UA RARE (*)   ? All other components within normal limits  ?LIPASE, BLOOD  ?CBC WITH DIFFERENTIAL/PLATELET  ?PREGNANCY, URINE  ? ?Procedures ?Procedures  ? ? ?Medications Ordered in ED ?Medications  ?ondansetron (ZOFRAN) injection 4 mg (4 mg Intravenous Given 02/10/22 2344)  ?lactated ringers bolus 1,000 mL (0 mLs Intravenous Stopped 02/11/22 0045)  ? ? ?ED Course/ Medical Decision Making/ A&P ?  ?                        ?Medical Decision Making ?Amount and/or Complexity of Data Reviewed ?Labs: ordered. ? ?Risk ?Prescription drug management. ? ? ?Nausea, vomiting, diarrhea and pattern most consistent with viral gastroenteritis.  Doubt food poisoning.  Doubt bowel obstruction.  Labs show borderline hyponatremia which is probably related to moderately elevated glucose and not felt to be clinically significant.  There is no ketonuria, anion gap is normal.  No evidence of ketoacidosis.  She will be given IV fluids, IV ondansetron.  Currently denies sense of rectal urgency, will hold off on loperamide for now.  Old records reviewed, and she has no relevant past visits. ? ?She feels much better after above-noted treatment.  She is discharged with prescription for ondansetron oral dissolving tablet and told to use over-the-counter loperamide as needed. ? ?Final Clinical Impression(s) / ED Diagnoses ?Final diagnoses:  ?Nausea vomiting and diarrhea  ?Hyponatremia  ? ? ?Rx / DC Orders ?ED Discharge Orders   ? ?      Ordered  ?  ondansetron (ZOFRAN ODT) 4 MG disintegrating tablet  Every 8 hours PRN       ? 02/11/22 0115  ? ?  ?  ? ?  ? ? ?  ?Delora Fuel, MD ?35/59/74 0116 ? ?

## 2022-02-10 NOTE — ED Notes (Signed)
Pt was able to drink gatorade in lobby and has not vomited ?

## 2022-02-11 MED ORDER — ONDANSETRON 4 MG PO TBDP: 4.0000 mg | ORAL_TABLET | Freq: Three times a day (TID) | ORAL | 0 refills | Status: AC | PRN

## 2022-02-11 NOTE — Discharge Instructions (Signed)
Drink plenty of fluids. ? ?Take loperamide (Imodium A-D) as needed for diarrhea. ?

## 2022-02-20 ENCOUNTER — Ambulatory Visit: Payer: 59 | Admitting: Vascular Surgery

## 2022-02-20 ENCOUNTER — Ambulatory Visit (HOSPITAL_COMMUNITY)
Admission: RE | Admit: 2022-02-20 | Discharge: 2022-02-20 | Disposition: A | Discharge status: Home / Self Care | Payer: 59 | Source: Ambulatory Visit | Attending: Vascular Surgery | Admitting: Vascular Surgery

## 2022-02-20 ENCOUNTER — Encounter: Payer: Self-pay | Admitting: Vascular Surgery

## 2022-02-20 DIAGNOSIS — I8393 Asymptomatic varicose veins of bilateral lower extremities: Secondary | ICD-10-CM

## 2022-02-20 DIAGNOSIS — I872 Venous insufficiency (chronic) (peripheral): Secondary | ICD-10-CM | POA: Insufficient documentation

## 2022-02-20 NOTE — Progress Notes (Signed)
? ? ?Patient name: Brittany Oneill MRN: [id number] DOB: 1986/01/30 Sex: female ? ?REASON FOR CONSULT: Evaluate lower extremity edema ? ?HPI: ?Brittany Oneill is a 36 y.o. female, with history of diabetes and scoliosis that presents for evaluation of left lower extremity edema and discoloration.  She states she has had swelling in the left leg for the last 2.5 years.  She has no history of DVT.  She is on her feet for long period of time working 2-3 jobs.  No history of wearing compression stockings.  She does noted significant discoloration to the left leg above the ankle medially that is painful. ? ?Past Medical History:  ?Diagnosis Date  ? Arthritis   ? Asthma   ? Back pain   ? Diabetes mellitus without complication ([place])   ? Migraines   ? Scoliosis   ? ? ?Past Surgical History:  ?Procedure Laterality Date  ? CESAREAN SECTION    ? CHOLECYSTECTOMY    ? FOOT SURGERY    ? HYSTEROSCOPY N/A 01/02/2022  ? Procedure: HYSTEROSCOPY;  Surgeon: Nunzio Cobbs, MD;  Location: [facility];  Service: Gynecology;  Laterality: N/A;  ? IUD REMOVAL N/A 01/02/2022  ? Procedure: INTRAUTERINE DEVICE (IUD) REMOVAL;  Surgeon: Nunzio Cobbs, MD;  Location[facility];  Service: Gynecology;  Laterality: N/A;  ? ? ?Family History  ?Problem Relation Age of Onset  ? Hypertension Mother   ? Hypertension Father   ? Diabetes Father   ? ? ?SOCIAL HISTORY: ?Social History  ? ?Socioeconomic History  ? Marital status: Single  ?  Spouse name: Not on file  ? Number of children: Not on file  ? Years of education: Not on file  ? Highest education level: Not on file  ?Occupational History  ? Not on file  ?Tobacco Use  ? Smoking status: Never  ? Smokeless tobacco: Never  ?Vaping Use  ? Vaping Use: Never used  ?Substance and Sexual Activity  ? Alcohol use: Yes  ?  Comment: social  ? Drug use: No  ? Sexual activity: Yes  ?  Birth control/protection: I.U.D.  ?Other Topics Concern  ? Not on file  ?Social  History Narrative  ? Not on file  ? ?Social Determinants of Health  ? ?Financial Resource Strain: Not on file  ?Food Insecurity: Not on file  ?Transportation Needs: Not on file  ?Physical Activity: Not on file  ?Stress: Not on file  ?Social Connections: Not on file  ?Intimate Partner Violence: Not on file  ? ? ?Allergies  ?Allergen Reactions  ? Penicillins Anaphylaxis  ? Metformin Nausea And Vomiting  ? Bee Venom   ? Sulfa Antibiotics Hives  ? ? ?Current Outpatient Medications  ?Medication Sig Dispense Refill  ? albuterol (PROVENTIL HFA;VENTOLIN HFA) 108 (90 Base) MCG/ACT inhaler Inhale 1-2 puffs into the lungs every 6 (six) hours as needed for wheezing or shortness of breath.    ? EPINEPHrine (EPIPEN 2-PAK) 0.3 mg/0.3 mL IJ SOAJ injection Inject 0.3 mLs (0.3 mg total) into the muscle once as needed (for severe allergic reaction). CAll 911 immediately if you have to use this medicine 1 Device 1  ? glipiZIDE (GLUCOTROL) 5 MG tablet Take 1 tablet (5 mg total) by mouth daily before breakfast. 30 tablet 0  ? hydrOXYzine (VISTARIL) 25 MG capsule Take 1 capsule (25 mg total) by mouth 3 (three) times daily as needed. 30 capsule 0  ? ibuprofen (ADVIL) 800 MG tablet Take by mouth.    ?  lisinopril (ZESTRIL) 10 MG tablet Take 1 tablet (10 mg total) by mouth daily. 30 tablet 0  ? ondansetron (ZOFRAN ODT) 4 MG disintegrating tablet Take 1 tablet (4 mg total) by mouth every 8 (eight) hours as needed for nausea or vomiting. 20 tablet 0  ? Prenatal Vit-Fe Fumarate-FA (PRENATAL MULTIVITAMIN) TABS tablet Take 1 tablet by mouth daily at 12 noon. Daily    ? ?No current facility-administered medications for this visit.  ? ? ?REVIEW OF SYSTEMS:  ?'[X]'$  denotes positive finding, '[ ]'$  denotes negative finding ?Cardiac  Comments:  ?Chest pain or chest pressure:    ?Shortness of breath upon exertion:    ?Short of breath when lying flat:    ?Irregular heart rhythm:    ?    ?Vascular    ?Pain in calf, thigh, or hip brought on by ambulation:     ?Pain in feet at night that wakes you up from your sleep:     ?Blood clot in your veins:    ?Leg swelling:  x left  ?    ?Pulmonary    ?Oxygen at home:    ?Productive cough:     ?Wheezing:     ?    ?Neurologic    ?Sudden weakness in arms or legs:     ?Sudden numbness in arms or legs:     ?Sudden onset of difficulty speaking or slurred speech:    ?Temporary loss of vision in one eye:     ?Problems with dizziness:     ?    ?Gastrointestinal    ?Blood in stool:     ?Vomited blood:     ?    ?Genitourinary    ?Burning when urinating:     ?Blood in urine:    ?    ?Psychiatric    ?Major depression:     ?    ?Hematologic    ?Bleeding problems:    ?Problems with blood clotting too easily:    ?    ?Skin    ?Rashes or ulcers:    ?    ?Constitutional    ?Fever or chills:    ? ? ?PHYSICAL EXAM: ?Vitals:  ? 02/20/22 1155  ?BP: (!) 153/97  ?Pulse: 83  ?Resp: 16  ?Temp: (!) 97.3 ?F (36.3 ?C)  ?TempSrc: Temporal  ?SpO2: 96%  ?Weight: (!) 328 lb (148.8 kg)  ?Height: '5\' 10"'$  (1.778 m)  ? ? ?GENERAL: The patient is a well-nourished female, in no acute distress. The vital signs are documented above. ?CARDIAC: There is a regular rate and rhythm.  ?VASCULAR:  ?Bilateral DP pulses palpable ?Significant lipodermatosclerosis left medial calf above the ankle ?No open ulcerations ?PULMONARY: No respiratory distress. ?ABDOMEN: Soft and non-tender. ?MUSCULOSKELETAL: There are no major deformities or cyanosis. ?NEUROLOGIC: No focal weakness or paresthesias are detected. ?PSYCHIATRIC: The patient has a normal affect. ? ? ? ? ?DATA:  ? ?Lower Venous Reflux Study  ? ?Patient Name:  Brittany Oneill  Date of Exam:   02/20/2022  ?Medical Rec #: [id number]         Accession #:    [id number]  ?Date of Birth: 08/13/1986        Patient Gender: F  ?Patient Age:   62 years  ?Exam Location:  [facility]  ?Procedure:      VAS [place] LOWER EXTREMITY VENOUS REFLUX  ?Referring Phys: Monica Martinez   ? ? ?---------------------------------------------------------------------------  ?-----  ?   ?Indications: Edema, varicosities and brawny changes to  left lower leg.  ?   ?Performing Technologist: Ronal Fear RVS, RCS  ? ?   ?Examination Guidelines: A complete evaluation includes B-mode imaging,  ?spectral  ?Doppler, color Doppler, and power Doppler as needed of all accessible  ?portions  ?of each vessel. Bilateral testing is considered an integral part of a  ?complete  ?examination. Limited examinations for reoccurring indications may be  ?performed  ?as noted. The reflux portion of the exam is performed with the patient in  ?reverse Trendelenburg.  ?Significant venous reflux is defined as >500 ms in the superficial venous  ?system, and >1 second in the deep venous system.  ? ?   ?+-----------------+---------+------+-----------+------------+--------------  ?----+  ?LEFT             Reflux NoRefluxReflux TimeDiameter cmsComments        ?      ?                           Yes                                         ?      ?+-----------------+---------+------+-----------+------------+--------------  ?----+  ?CFV                        yes   >1 second                             ?      ?+-----------------+---------+------+-----------+------------+--------------  ?----+  ?FV mid                     yes   >1 second                             ?      ?+-----------------+---------+------+-----------+------------+--------------  ?----+  ?Popliteal                  yes   >1 second                             ?      ?+-----------------+---------+------+-----------+------------+--------------  ?----+  ?GSV at [facility]                 yes    >500 ms      0.98                    ?      ?+-----------------+---------+------+-----------+------------+--------------  ?----+  ?GSV prox thigh   no                            0.54                    ?       ?+-----------------+---------+------+-----------+------------+--------------  ?----+  ?GSV mid thigh    no                            0.39                    ?      ?+-----------------+---------+------+-----------+------------+--------------  ?----+  ?  GSV dist thigh             yes    >500 ms      0.57    perforator      ?      ?

## 2022-02-26 ENCOUNTER — Other Ambulatory Visit: Payer: Self-pay | Admitting: *Deleted

## 2022-02-26 DIAGNOSIS — I872 Venous insufficiency (chronic) (peripheral): Secondary | ICD-10-CM

## 2022-04-30 NOTE — Progress Notes (Deleted)
GYNECOLOGY  VISIT   HPI: 36 y.o.   Single  African American  female   [phone] with No LMP recorded.   here for irregular bleeding.    GYNECOLOGIC HISTORY: No LMP recorded. Contraception:  ***None Menopausal hormone therapy:  none Last mammogram:  n/a Last pap smear:   2022 normal per patient at [facility]        OB History     Gravida  7   Para  1   Term  1   Preterm      AB  6   Living  1      SAB  5   IAB  1   Ectopic      Multiple      Live Births                 Patient Active Problem List   Diagnosis Date Noted   Chronic venous insufficiency 02/20/2022    Past Medical History:  Diagnosis Date   Arthritis    Asthma    Back pain    Diabetes mellitus without complication ([place])    Migraines    Scoliosis     Past Surgical History:  Procedure Laterality Date   CESAREAN SECTION     CHOLECYSTECTOMY     FOOT SURGERY     HYSTEROSCOPY N/A 01/02/2022   Procedure: HYSTEROSCOPY;  Surgeon: Nunzio Cobbs, MD;  Location: [facility];  Service: Gynecology;  Laterality: N/A;   IUD REMOVAL N/A 01/02/2022   Procedure: INTRAUTERINE DEVICE (IUD) REMOVAL;  Surgeon: Nunzio Cobbs, MD;  Location: [facility];  Service: Gynecology;  Laterality: N/A;    Current Outpatient Medications  Medication Sig Dispense Refill   albuterol (PROVENTIL HFA;VENTOLIN HFA) 108 (90 Base) MCG/ACT inhaler Inhale 1-2 puffs into the lungs every 6 (six) hours as needed for wheezing or shortness of breath.     EPINEPHrine (EPIPEN 2-PAK) 0.3 mg/0.3 mL IJ SOAJ injection Inject 0.3 mLs (0.3 mg total) into the muscle once as needed (for severe allergic reaction). CAll 911 immediately if you have to use this medicine 1 Device 1   glipiZIDE (GLUCOTROL) 5 MG tablet Take 1 tablet (5 mg total) by mouth daily before breakfast. 30 tablet 0   hydrOXYzine (VISTARIL) 25 MG capsule Take 1 capsule (25 mg total) by mouth 3 (three) times daily as needed. 30  capsule 0   ibuprofen (ADVIL) 800 MG tablet Take by mouth.     lisinopril (ZESTRIL) 10 MG tablet Take 1 tablet (10 mg total) by mouth daily. 30 tablet 0   ondansetron (ZOFRAN ODT) 4 MG disintegrating tablet Take 1 tablet (4 mg total) by mouth every 8 (eight) hours as needed for nausea or vomiting. 20 tablet 0   Prenatal Vit-Fe Fumarate-FA (PRENATAL MULTIVITAMIN) TABS tablet Take 1 tablet by mouth daily at 12 noon. Daily     No current facility-administered medications for this visit.     ALLERGIES: Penicillins, Metformin, Bee venom, and Sulfa antibiotics  Family History  Problem Relation Age of Onset   Hypertension Mother    Hypertension Father    Diabetes Father     Social History   Socioeconomic History   Marital status: Single    Spouse name: Not on file   Number of children: Not on file   Years of education: Not on file   Highest education level: Not on file  Occupational History   Not on file  Tobacco  Use   Smoking status: Never   Smokeless tobacco: Never  Vaping Use   Vaping Use: Never used  Substance and Sexual Activity   Alcohol use: Yes    Comment: social   Drug use: No   Sexual activity: Yes    Birth control/protection: I.U.D.  Other Topics Concern   Not on file  Social History Narrative   Not on file   Social Determinants of Health   Financial Resource Strain: Not on file  Food Insecurity: Not on file  Transportation Needs: Not on file  Physical Activity: Not on file  Stress: Not on file  Social Connections: Not on file  Intimate Partner Violence: Not on file    Review of Systems  PHYSICAL EXAMINATION:    There were no vitals taken for this visit.    General appearance: alert, cooperative and appears stated age Head: Normocephalic, without obvious abnormality, atraumatic Neck: no adenopathy, supple, symmetrical, trachea midline and thyroid normal to inspection and palpation Lungs: clear to auscultation bilaterally Breasts: normal appearance,  no masses or tenderness, No nipple retraction or dimpling, No nipple discharge or bleeding, No axillary or supraclavicular adenopathy Heart: regular rate and rhythm Abdomen: soft, non-tender, no masses,  no organomegaly Extremities: extremities normal, atraumatic, no cyanosis or edema Skin: Skin color, texture, turgor normal. No rashes or lesions Lymph nodes: Cervical, supraclavicular, and axillary nodes normal. No abnormal inguinal nodes palpated Neurologic: Grossly normal  Pelvic: External genitalia:  no lesions              Urethra:  normal appearing urethra with no masses, tenderness or lesions              Bartholins and Skenes: normal                 Vagina: normal appearing vagina with normal color and discharge, no lesions              Cervix: no lesions                Bimanual Exam:  Uterus:  normal size, contour, position, consistency, mobility, non-tender              Adnexa: no mass, fullness, tenderness              Rectal exam: {yes no:[id number]}.  Confirms.              Anus:  normal sphincter tone, no lesions  Chaperone was present for exam:  ***  ASSESSMENT     PLAN     An After Visit Summary was printed and given to the patient.  ______ minutes face to face time of which over 50% was spent in counseling.

## 2022-05-01 ENCOUNTER — Ambulatory Visit: Payer: 59 | Admitting: Obstetrics and Gynecology

## 2022-05-01 NOTE — Progress Notes (Unsigned)
GYNECOLOGY  VISIT   HPI: 36 y.o.   Single  African American  female   [phone] with Patient's last menstrual period was 04/10/2022 (exact date).   here for irregular bleeding. She bleeds after intercourse. Not certain if she wants to try for pregnancy or have a hysterectomy.    Had IUD removal hysteroscopically on 01/02/22.  She has 2 fibroids, 5.13 and 7.73 cm, the largest of which encroaches on the endometrium.  Her uterus overall is 14 week size.  Periods are irregular.  Bleeding lasts for 2 weeks, stops, and starts again.  Wearing pull ups and a pad at times due to heavy bleeding.  Cramping is an understatement per patient.  2 Tylenol and Ibuprofen 800 do not touch the pain.   Has elevated A1C and is not receiving care for this.  Has not seen [facility] as they do not take her insurance.  She feels the lows more than she feels the highs.   New job grooming dogs.   GYNECOLOGIC HISTORY: Patient's last menstrual period was 04/10/2022 (exact date). Contraception:  None--trying for pregnancy Menopausal hormone therapy:  n/a Last mammogram:  n/a Last pap smear:   2022 normal per patient at [facility]        OB History     Gravida  7   Para  1   Term  1   Preterm      AB  6   Living  1      SAB  5   IAB  1   Ectopic      Multiple      Live Births                 Patient Active Problem List   Diagnosis Date Noted   Chronic venous insufficiency 02/20/2022    Past Medical History:  Diagnosis Date   Arthritis    Asthma    Back pain    Diabetes mellitus without complication ([place])    Migraines    Scoliosis     Past Surgical History:  Procedure Laterality Date   CESAREAN SECTION     CHOLECYSTECTOMY     FOOT SURGERY     HYSTEROSCOPY N/A 01/02/2022   Procedure: HYSTEROSCOPY;  Surgeon: Nunzio Cobbs, MD;  Location: [facility];  Service: Gynecology;  Laterality: N/A;   IUD REMOVAL N/A 01/02/2022   Procedure: INTRAUTERINE  DEVICE (IUD) REMOVAL;  Surgeon: Nunzio Cobbs, MD;  Location: [facility];  Service: Gynecology;  Laterality: N/A;    Current Outpatient Medications  Medication Sig Dispense Refill   albuterol (PROVENTIL HFA;VENTOLIN HFA) 108 (90 Base) MCG/ACT inhaler Inhale 1-2 puffs into the lungs every 6 (six) hours as needed for wheezing or shortness of breath.     clindamycin (CLEOCIN) 300 MG capsule Take 300 mg by mouth 3 (three) times daily.     EPINEPHrine (EPIPEN 2-PAK) 0.3 mg/0.3 mL IJ SOAJ injection Inject 0.3 mLs (0.3 mg total) into the muscle once as needed (for severe allergic reaction). CAll 911 immediately if you have to use this medicine 1 Device 1   glipiZIDE (GLUCOTROL) 5 MG tablet Take 1 tablet (5 mg total) by mouth daily before breakfast. 30 tablet 0   hydrOXYzine (VISTARIL) 25 MG capsule Take 1 capsule (25 mg total) by mouth 3 (three) times daily as needed. 30 capsule 0   ibuprofen (ADVIL) 800 MG tablet Take by mouth.     lisinopril (ZESTRIL)  10 MG tablet Take 1 tablet (10 mg total) by mouth daily. 30 tablet 0   ondansetron (ZOFRAN ODT) 4 MG disintegrating tablet Take 1 tablet (4 mg total) by mouth every 8 (eight) hours as needed for nausea or vomiting. 20 tablet 0   Prenatal Vit-Fe Fumarate-FA (PRENATAL MULTIVITAMIN) TABS tablet Take 1 tablet by mouth daily at 12 noon. Daily     No current facility-administered medications for this visit.     ALLERGIES: Penicillins, Metformin, Bee venom, and Sulfa antibiotics  Family History  Problem Relation Age of Onset   Hypertension Mother    Hypertension Father    Diabetes Father     Social History   Socioeconomic History   Marital status: Single    Spouse name: Not on file   Number of children: Not on file   Years of education: Not on file   Highest education level: Not on file  Occupational History   Not on file  Tobacco Use   Smoking status: Never   Smokeless tobacco: Never  Vaping Use   Vaping Use:  Never used  Substance and Sexual Activity   Alcohol use: Yes    Comment: social   Drug use: No   Sexual activity: Yes    Birth control/protection: I.U.D.  Other Topics Concern   Not on file  Social History Narrative   Not on file   Social Determinants of Health   Financial Resource Strain: Not on file  Food Insecurity: Not on file  Transportation Needs: Not on file  Physical Activity: Not on file  Stress: Not on file  Social Connections: Not on file  Intimate Partner Violence: Not on file    Review of Systems  Genitourinary:  Positive for vaginal bleeding (bleeding after intercourse).  All other systems reviewed and are negative.   PHYSICAL EXAMINATION:    BP 130/84   Pulse 87   Ht '5\' 9"'$  (1.753 m)   Wt (!) 340 lb (154.2 kg)   LMP 04/10/2022 (Exact Date)   SpO2 97%   BMI 50.21 kg/m     General appearance: alert, cooperative and appears stated age   Pelvic: External genitalia:  grey coloration of the vulvar skin.               Urethra:  normal appearing urethra with no masses, tenderness or lesions              Bartholins and Skenes: normal                 Vagina: normal appearing vagina with normal color and discharge, no lesions              Cervix: no lesions.  No blood noted.                 Bimanual Exam:  Uterus:  13 week size.                Adnexa: no mass, fullness, tenderness          Chaperone was present for exam:  Estill Bamberg, CMA  ASSESSMENT  Fibroids.  Menorrhagia with irregular menses.  Postcoital bleeding.  Vulvovaginitis.  Elevated A1C.   PLAN  UPT negative today.  Cervical screening for GC/TC/trich/yeast.  I anticipate treatment with Diflucan 150 mg po q 71 hours x 3 doses. Will check A1C and CBC today.  I anticipate a referral to PCP for diabetes care with [facility].  I discussed Micronor or Depo Provera to treat  bleeding.  I did discuss that these prevent pregnancy.    She may return with her next menstruation to receive a Depo  Provera 150 mg IM injection.  If patient is working toward surgical care, she understands that her diabetes will need to be in control.   An After Visit Summary was printed and given to the patient.  30 min  total time was spent for this patient encounter, including preparation, face-to-face counseling with the patient, coordination of care, and documentation of the encounter.

## 2022-05-02 ENCOUNTER — Ambulatory Visit (INDEPENDENT_AMBULATORY_CARE_PROVIDER_SITE_OTHER): Payer: 59 | Admitting: Obstetrics and Gynecology

## 2022-05-02 ENCOUNTER — Other Ambulatory Visit (HOSPITAL_COMMUNITY)
Admission: RE | Admit: 2022-05-02 | Discharge: 2022-05-02 | Disposition: A | Discharge status: Home / Self Care | Payer: 59 | Source: Ambulatory Visit | Attending: Obstetrics and Gynecology | Admitting: Obstetrics and Gynecology

## 2022-05-02 ENCOUNTER — Encounter: Payer: Self-pay | Admitting: Obstetrics and Gynecology

## 2022-05-02 VITALS — BP 130/84 | HR 87 | Ht 69.0 in | Wt 340.0 lb

## 2022-05-02 DIAGNOSIS — Z113 Encounter for screening for infections with a predominantly sexual mode of transmission: Secondary | ICD-10-CM | POA: Diagnosis not present

## 2022-05-02 DIAGNOSIS — N760 Acute vaginitis: Secondary | ICD-10-CM | POA: Insufficient documentation

## 2022-05-02 DIAGNOSIS — N930 Postcoital and contact bleeding: Secondary | ICD-10-CM

## 2022-05-02 DIAGNOSIS — N926 Irregular menstruation, unspecified: Secondary | ICD-10-CM

## 2022-05-02 DIAGNOSIS — D219 Benign neoplasm of connective and other soft tissue, unspecified: Secondary | ICD-10-CM

## 2022-05-02 DIAGNOSIS — R739 Hyperglycemia, unspecified: Secondary | ICD-10-CM

## 2022-05-02 DIAGNOSIS — N921 Excessive and frequent menstruation with irregular cycle: Secondary | ICD-10-CM

## 2022-05-02 LAB — PREGNANCY, URINE: Preg Test, Ur: NEGATIVE

## 2022-05-03 ENCOUNTER — Telehealth: Payer: Self-pay | Admitting: *Deleted

## 2022-05-03 LAB — CBC
HCT: 39.6 % (ref 35.0–45.0)
Hemoglobin: 12.9 g/dL (ref 11.7–15.5)
MCH: 27.9 pg (ref 27.0–33.0)
MCHC: 32.6 g/dL (ref 32.0–36.0)
MCV: 85.7 fL (ref 80.0–100.0)
MPV: 10.2 fL (ref 7.5–12.5)
Platelets: 320 10*3/uL (ref 140–400)
RBC: 4.62 10*6/uL (ref 3.80–5.10)
RDW: 12.9 % (ref 11.0–15.0)
WBC: 5.9 10*3/uL (ref 3.8–10.8)

## 2022-05-03 LAB — HEMOGLOBIN A1C
Hgb A1c MFr Bld: 12.7 % of total Hgb — ABNORMAL HIGH (ref <5.7)
Mean Plasma Glucose: 318 mg/dL
eAG (mmol/L): 17.6 mmol/L

## 2022-05-03 NOTE — Telephone Encounter (Addendum)
Patient informed with below, I called [facility] will no longer accept her insurance in August, no appointments at [facility] location this month. I called [facility] and [facility] and neither location accepts her health plan. I also called [facility] and they no longer accept Friday's health plan. I left message for patient to call to relay.

## 2022-05-03 NOTE — Telephone Encounter (Signed)
What about the Health Department?

## 2022-05-03 NOTE — Telephone Encounter (Signed)
-----   Message from Nunzio Cobbs, MD sent at 05/03/2022 11:50 AM EDT ----- Please contact patient with results of her testing.  Her hemoglobin A1C is 12.7, which indicates she has very high blood sugar and uncontrolled diabetes.  Her hemoglobin is 12.7, so she is not anemic.   I would like to have her referred for an urgent consult with a diabetes clinic with [facility].  Please assist with the referral.

## 2022-05-04 LAB — CERVICOVAGINAL ANCILLARY ONLY
Candida Glabrata: POSITIVE — AB
Candida Vaginitis: POSITIVE — AB
Chlamydia: NEGATIVE
Comment: NEGATIVE
Comment: NEGATIVE
Comment: NEGATIVE
Comment: NEGATIVE
Comment: NORMAL
Neisseria Gonorrhea: NEGATIVE
Trichomonas: POSITIVE — AB

## 2022-05-04 NOTE — Telephone Encounter (Signed)
I called health department and they don't accept Friday's plan. I was given cost amount that patient could pay to establish care which would be out of pocket cost. I relayed this information to patient and the cost are not affordable for her to pay out of pocket.

## 2022-05-10 ENCOUNTER — Other Ambulatory Visit: Payer: Self-pay | Admitting: *Deleted

## 2022-05-10 ENCOUNTER — Telehealth: Payer: Self-pay | Admitting: Obstetrics and Gynecology

## 2022-05-10 MED ORDER — FLUCONAZOLE 150 MG PO TABS: ORAL_TABLET | ORAL | 0 refills | Status: AC

## 2022-05-10 MED ORDER — FLUCONAZOLE 150 MG PO TABS: 150.0000 mg | ORAL_TABLET | Freq: Every day | ORAL | 0 refills | Status: DC

## 2022-05-10 MED ORDER — METRONIDAZOLE 500 MG PO TABS: 500.0000 mg | ORAL_TABLET | Freq: Two times a day (BID) | ORAL | 0 refills | Status: AC

## 2022-05-10 NOTE — Telephone Encounter (Signed)
Please make a correction in the Diflucan prescription.  Diflucan 150 mg Sig:  take 1 tablet (150 mg) po q 3 days x 3 doses.  Disp:  3 RF:  none.

## 2022-05-10 NOTE — Telephone Encounter (Signed)
The below was sent to pharmacy.

## 2022-05-14 NOTE — Telephone Encounter (Signed)
I provided Jonna Clark with patient MRN via staff message she is the [employer] This program is with [employer]. Jerene Pitch will verify that she is eligible for the program. She will call and discuss with patient. I spoke with patient last week about this when I spoke with her about her lab results and she said is interested. Jerene Pitch will call to discuss with patient.

## 2022-05-16 NOTE — Telephone Encounter (Signed)
Brooke replied back via staff message stating "Cristela [employer] Woman is for uninsured or underinsured ladies ages 3-64. " I called patient and left message to call to relay and give other recommends such has health department, patient did mention previously she is seeking another insurance.

## 2022-08-25 ENCOUNTER — Encounter (HOSPITAL_BASED_OUTPATIENT_CLINIC_OR_DEPARTMENT_OTHER): Payer: Self-pay | Admitting: Emergency Medicine

## 2022-08-25 ENCOUNTER — Other Ambulatory Visit: Payer: Self-pay

## 2022-08-25 ENCOUNTER — Emergency Department (HOSPITAL_BASED_OUTPATIENT_CLINIC_OR_DEPARTMENT_OTHER)
Admission: EM | Admit: 2022-08-25 | Discharge: 2022-08-26 | Disposition: A | Discharge status: Home / Self Care | Payer: Commercial Managed Care - HMO | Attending: Emergency Medicine | Admitting: Emergency Medicine

## 2022-08-25 DIAGNOSIS — Y92410 Unspecified street and highway as the place of occurrence of the external cause: Secondary | ICD-10-CM | POA: Insufficient documentation

## 2022-08-25 DIAGNOSIS — S169XXA Unspecified injury of muscle, fascia and tendon at neck level, initial encounter: Secondary | ICD-10-CM | POA: Diagnosis present

## 2022-08-25 DIAGNOSIS — S12001A Unspecified nondisplaced fracture of first cervical vertebra, initial encounter for closed fracture: Secondary | ICD-10-CM

## 2022-08-25 DIAGNOSIS — M79602 Pain in left arm: Secondary | ICD-10-CM | POA: Insufficient documentation

## 2022-08-25 MED ORDER — LIDOCAINE 5 % EX PTCH
3.0000 | MEDICATED_PATCH | CUTANEOUS | Status: DC
  Administered 2022-08-26: 3 via TRANSDERMAL
  Filled 2022-08-25: qty 3
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
## 2022-08-26 ENCOUNTER — Emergency Department (HOSPITAL_BASED_OUTPATIENT_CLINIC_OR_DEPARTMENT_OTHER): Payer: Commercial Managed Care - HMO

## 2022-08-26 MED ORDER — IBUPROFEN 800 MG PO TABS: 800.0000 mg | ORAL_TABLET | Freq: Three times a day (TID) | ORAL | 0 refills | Status: AC

## 2022-08-26 MED ORDER — NAPROXEN 500 MG PO TABS: 500.0000 mg | ORAL_TABLET | Freq: Two times a day (BID) | ORAL | 0 refills | Status: DC
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
## 2022-08-26 NOTE — ED Notes (Signed)
Pt educated on importance of keeping MJ collar on at [place] until cleared to remove by neuro, Pt verbalized understanding.

## 2022-09-25 ENCOUNTER — Other Ambulatory Visit: Payer: Self-pay | Admitting: Neurosurgery

## 2022-09-25 DIAGNOSIS — M5412 Radiculopathy, cervical region: Secondary | ICD-10-CM

## 2022-10-20 ENCOUNTER — Ambulatory Visit
Admission: RE | Admit: 2022-10-20 | Discharge: 2022-10-20 | Disposition: A | Discharge status: Home / Self Care | Payer: Commercial Managed Care - HMO | Source: Ambulatory Visit | Attending: Neurosurgery | Admitting: Neurosurgery

## 2022-10-20 DIAGNOSIS — M5412 Radiculopathy, cervical region: Secondary | ICD-10-CM

## 2022-11-05 ENCOUNTER — Other Ambulatory Visit: Payer: Self-pay

## 2022-11-05 ENCOUNTER — Emergency Department (HOSPITAL_BASED_OUTPATIENT_CLINIC_OR_DEPARTMENT_OTHER)
Admission: EM | Admit: 2022-11-05 | Discharge: 2022-11-05 | Disposition: A | Discharge status: Home / Self Care | Payer: Commercial Managed Care - HMO | Attending: Emergency Medicine | Admitting: Emergency Medicine

## 2022-11-05 DIAGNOSIS — J45909 Unspecified asthma, uncomplicated: Secondary | ICD-10-CM | POA: Diagnosis not present

## 2022-11-05 DIAGNOSIS — Y793 Surgical instruments, materials and orthopedic devices (including sutures) associated with adverse incidents: Secondary | ICD-10-CM | POA: Diagnosis not present

## 2022-11-05 DIAGNOSIS — Z7984 Long term (current) use of oral hypoglycemic drugs: Secondary | ICD-10-CM | POA: Diagnosis not present

## 2022-11-05 DIAGNOSIS — T849XXA Unspecified complication of internal orthopedic prosthetic device, implant and graft, initial encounter: Secondary | ICD-10-CM | POA: Insufficient documentation

## 2022-11-05 DIAGNOSIS — E119 Type 2 diabetes mellitus without complications: Secondary | ICD-10-CM | POA: Insufficient documentation

## 2022-11-05 NOTE — ED Notes (Signed)
New [place] J placed on pt. Pt denies pain, denies new symptoms. RN reviewed discharge instructions, pt had no further questions.
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
## 2022-12-19 ENCOUNTER — Ambulatory Visit: Payer: Commercial Managed Care - HMO | Attending: Neurosurgery | Admitting: Physical Therapy

## 2022-12-19 ENCOUNTER — Encounter: Payer: Self-pay | Admitting: Physical Therapy

## 2022-12-19 ENCOUNTER — Other Ambulatory Visit: Payer: Self-pay

## 2022-12-19 DIAGNOSIS — M6281 Muscle weakness (generalized): Secondary | ICD-10-CM | POA: Diagnosis present

## 2022-12-19 DIAGNOSIS — M25512 Pain in left shoulder: Secondary | ICD-10-CM | POA: Diagnosis present

## 2022-12-19 DIAGNOSIS — R293 Abnormal posture: Secondary | ICD-10-CM | POA: Insufficient documentation

## 2022-12-19 DIAGNOSIS — M542 Cervicalgia: Secondary | ICD-10-CM | POA: Diagnosis not present

## 2022-12-19 DIAGNOSIS — G8929 Other chronic pain: Secondary | ICD-10-CM | POA: Insufficient documentation

## 2022-12-19 DIAGNOSIS — M25511 Pain in right shoulder: Secondary | ICD-10-CM | POA: Diagnosis present

## 2022-12-25 ENCOUNTER — Ambulatory Visit: Payer: Commercial Managed Care - HMO

## 2022-12-27 ENCOUNTER — Ambulatory Visit: Payer: Commercial Managed Care - HMO | Attending: Neurosurgery | Admitting: Physical Therapy

## 2022-12-27 ENCOUNTER — Encounter: Payer: Self-pay | Admitting: Physical Therapy

## 2022-12-27 DIAGNOSIS — M25511 Pain in right shoulder: Secondary | ICD-10-CM | POA: Insufficient documentation

## 2022-12-27 DIAGNOSIS — M25512 Pain in left shoulder: Secondary | ICD-10-CM | POA: Insufficient documentation

## 2022-12-27 DIAGNOSIS — M542 Cervicalgia: Secondary | ICD-10-CM | POA: Diagnosis present

## 2022-12-27 DIAGNOSIS — R293 Abnormal posture: Secondary | ICD-10-CM | POA: Diagnosis present

## 2022-12-27 DIAGNOSIS — G8929 Other chronic pain: Secondary | ICD-10-CM | POA: Diagnosis present

## 2022-12-27 DIAGNOSIS — M6281 Muscle weakness (generalized): Secondary | ICD-10-CM | POA: Diagnosis present
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
## 2022-12-31 ENCOUNTER — Encounter: Payer: Self-pay | Admitting: Physical Therapy

## 2022-12-31 ENCOUNTER — Ambulatory Visit: Payer: No Typology Code available for payment source | Admitting: Physical Therapy

## 2022-12-31 DIAGNOSIS — M6281 Muscle weakness (generalized): Secondary | ICD-10-CM

## 2022-12-31 DIAGNOSIS — M542 Cervicalgia: Secondary | ICD-10-CM

## 2022-12-31 DIAGNOSIS — G8929 Other chronic pain: Secondary | ICD-10-CM

## 2022-12-31 DIAGNOSIS — R293 Abnormal posture: Secondary | ICD-10-CM
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
## 2023-01-07 ENCOUNTER — Ambulatory Visit: Payer: Commercial Managed Care - HMO

## 2023-01-07 DIAGNOSIS — G8929 Other chronic pain: Secondary | ICD-10-CM

## 2023-01-07 DIAGNOSIS — R293 Abnormal posture: Secondary | ICD-10-CM

## 2023-01-07 DIAGNOSIS — M542 Cervicalgia: Secondary | ICD-10-CM | POA: Diagnosis not present

## 2023-01-07 DIAGNOSIS — M6281 Muscle weakness (generalized): Secondary | ICD-10-CM
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
## 2023-01-10 ENCOUNTER — Ambulatory Visit: Payer: No Typology Code available for payment source

## 2023-01-10 DIAGNOSIS — M542 Cervicalgia: Secondary | ICD-10-CM

## 2023-01-10 DIAGNOSIS — M6281 Muscle weakness (generalized): Secondary | ICD-10-CM

## 2023-01-10 DIAGNOSIS — R293 Abnormal posture: Secondary | ICD-10-CM

## 2023-01-10 DIAGNOSIS — G8929 Other chronic pain: Secondary | ICD-10-CM
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
## 2023-01-14 ENCOUNTER — Encounter: Payer: Self-pay | Admitting: Physical Therapy

## 2023-01-14 ENCOUNTER — Ambulatory Visit: Payer: No Typology Code available for payment source | Admitting: Physical Therapy

## 2023-01-14 DIAGNOSIS — G8929 Other chronic pain: Secondary | ICD-10-CM

## 2023-01-14 DIAGNOSIS — M6281 Muscle weakness (generalized): Secondary | ICD-10-CM

## 2023-01-14 DIAGNOSIS — R293 Abnormal posture: Secondary | ICD-10-CM

## 2023-01-14 DIAGNOSIS — M542 Cervicalgia: Secondary | ICD-10-CM
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
## 2023-01-17 ENCOUNTER — Ambulatory Visit: Payer: Commercial Managed Care - HMO

## 2023-01-17 DIAGNOSIS — G8929 Other chronic pain: Secondary | ICD-10-CM

## 2023-01-17 DIAGNOSIS — M542 Cervicalgia: Secondary | ICD-10-CM | POA: Diagnosis not present

## 2023-01-17 DIAGNOSIS — M6281 Muscle weakness (generalized): Secondary | ICD-10-CM

## 2023-01-17 DIAGNOSIS — R293 Abnormal posture: Secondary | ICD-10-CM
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
## 2023-01-21 ENCOUNTER — Ambulatory Visit: Payer: Commercial Managed Care - HMO

## 2023-01-24 ENCOUNTER — Ambulatory Visit: Payer: Commercial Managed Care - HMO | Attending: Neurosurgery | Admitting: Physical Therapy

## 2023-01-24 ENCOUNTER — Encounter: Payer: Self-pay | Admitting: Physical Therapy

## 2023-01-24 DIAGNOSIS — R293 Abnormal posture: Secondary | ICD-10-CM | POA: Insufficient documentation

## 2023-01-24 DIAGNOSIS — M542 Cervicalgia: Secondary | ICD-10-CM | POA: Insufficient documentation

## 2023-01-24 DIAGNOSIS — G8929 Other chronic pain: Secondary | ICD-10-CM | POA: Diagnosis present

## 2023-01-24 DIAGNOSIS — M25512 Pain in left shoulder: Secondary | ICD-10-CM | POA: Insufficient documentation

## 2023-01-24 DIAGNOSIS — M6281 Muscle weakness (generalized): Secondary | ICD-10-CM | POA: Insufficient documentation

## 2023-01-24 DIAGNOSIS — M25511 Pain in right shoulder: Secondary | ICD-10-CM | POA: Diagnosis present
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
## 2023-01-28 ENCOUNTER — Ambulatory Visit: Payer: Commercial Managed Care - HMO | Admitting: Physical Therapy

## 2023-01-28 ENCOUNTER — Encounter: Payer: Self-pay | Admitting: Physical Therapy

## 2023-01-28 DIAGNOSIS — M6281 Muscle weakness (generalized): Secondary | ICD-10-CM

## 2023-01-28 DIAGNOSIS — M542 Cervicalgia: Secondary | ICD-10-CM | POA: Diagnosis not present

## 2023-01-28 DIAGNOSIS — G8929 Other chronic pain: Secondary | ICD-10-CM

## 2023-01-28 DIAGNOSIS — R293 Abnormal posture: Secondary | ICD-10-CM
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
## 2023-01-31 ENCOUNTER — Ambulatory Visit: Payer: Commercial Managed Care - HMO | Admitting: Physical Therapy

## 2023-01-31 ENCOUNTER — Encounter: Payer: Self-pay | Admitting: Physical Therapy

## 2023-01-31 DIAGNOSIS — M6281 Muscle weakness (generalized): Secondary | ICD-10-CM

## 2023-01-31 DIAGNOSIS — M542 Cervicalgia: Secondary | ICD-10-CM

## 2023-01-31 DIAGNOSIS — R293 Abnormal posture: Secondary | ICD-10-CM

## 2023-01-31 DIAGNOSIS — G8929 Other chronic pain: Secondary | ICD-10-CM
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
## 2023-02-07 ENCOUNTER — Encounter: Payer: Self-pay | Admitting: Physical Therapy

## 2023-02-07 ENCOUNTER — Ambulatory Visit: Payer: Commercial Managed Care - HMO | Admitting: Physical Therapy

## 2023-02-07 DIAGNOSIS — R293 Abnormal posture: Secondary | ICD-10-CM

## 2023-02-07 DIAGNOSIS — G8929 Other chronic pain: Secondary | ICD-10-CM

## 2023-02-07 DIAGNOSIS — M542 Cervicalgia: Secondary | ICD-10-CM | POA: Diagnosis not present

## 2023-02-07 DIAGNOSIS — M6281 Muscle weakness (generalized): Secondary | ICD-10-CM
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
## 2023-02-11 ENCOUNTER — Ambulatory Visit: Payer: No Typology Code available for payment source

## 2023-02-11 ENCOUNTER — Ambulatory Visit (HOSPITAL_COMMUNITY)
Admission: RE | Admit: 2023-02-11 | Discharge: 2023-02-11 | Disposition: A | Discharge status: Home / Self Care | Payer: Commercial Managed Care - HMO | Source: Ambulatory Visit | Attending: Vascular Surgery | Admitting: Vascular Surgery

## 2023-02-11 DIAGNOSIS — M542 Cervicalgia: Secondary | ICD-10-CM

## 2023-02-11 DIAGNOSIS — R293 Abnormal posture: Secondary | ICD-10-CM

## 2023-02-11 DIAGNOSIS — R609 Edema, unspecified: Secondary | ICD-10-CM

## 2023-02-11 DIAGNOSIS — I872 Venous insufficiency (chronic) (peripheral): Secondary | ICD-10-CM | POA: Insufficient documentation

## 2023-02-11 DIAGNOSIS — G8929 Other chronic pain: Secondary | ICD-10-CM

## 2023-02-11 DIAGNOSIS — M6281 Muscle weakness (generalized): Secondary | ICD-10-CM
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
## 2023-02-12 ENCOUNTER — Encounter: Payer: Self-pay | Admitting: Vascular Surgery

## 2023-02-12 ENCOUNTER — Ambulatory Visit (INDEPENDENT_AMBULATORY_CARE_PROVIDER_SITE_OTHER): Payer: Commercial Managed Care - HMO | Admitting: Vascular Surgery

## 2023-02-12 ENCOUNTER — Encounter (HOSPITAL_COMMUNITY): Payer: Commercial Managed Care - HMO

## 2023-02-12 VITALS — BP 136/83 | HR 73 | Temp 97.8°F | Resp 16 | Ht 70.0 in | Wt 327.0 lb

## 2023-02-12 DIAGNOSIS — I872 Venous insufficiency (chronic) (peripheral): Secondary | ICD-10-CM | POA: Diagnosis not present

## 2023-02-12 NOTE — Progress Notes (Signed)
Patient name: Brittany Oneill MRN: [id number] DOB: [birth year] Sex: female  REASON FOR CONSULT: F/U for lower extremity chronic venous insufficiency, left leg  HPI: Brittany Oneill is a 37 y.o. female, with history of diabetes and scoliosis that presents for interval follow-up of her left lower extremity edema and discoloration with evidence of chronic venous insufficiency.  She has had swelling in the left leg for the last 3 years now.  She has no history of DVT.  She is on her feet for long period of time working 2-3 jobs.  Since her last visit she got a promotion at work.  She has been wearing the compression stockings that we prescribed last time and elevating her legs.   Past Medical History:  Diagnosis Date   Arthritis    Asthma    Back pain    Diabetes mellitus without complication    Migraines    Scoliosis     Past Surgical History:  Procedure Laterality Date   CESAREAN SECTION     CHOLECYSTECTOMY     FOOT SURGERY     HYSTEROSCOPY N/A 01/02/2022   Procedure: HYSTEROSCOPY;  Surgeon: Patton Salles, MD;  Location: [facility];  Service: Gynecology;  Laterality: N/A;   IUD REMOVAL N/A 01/02/2022   Procedure: INTRAUTERINE DEVICE (IUD) REMOVAL;  Surgeon: Patton Salles, MD;  Location: [facility];  Service: Gynecology;  Laterality: N/A;    Family History  Problem Relation Age of Onset   Hypertension Mother    Hypertension Father    Diabetes Father     SOCIAL HISTORY: Social History   Socioeconomic History   Marital status: Single    Spouse name: Not on file   Number of children: Not on file   Years of education: Not on file   Highest education level: Not on file  Occupational History   Not on file  Tobacco Use   Smoking status: Never   Smokeless tobacco: Never  Vaping Use   Vaping Use: Never used  Substance and Sexual Activity   Alcohol use: Yes    Comment: social   Drug use: No   Sexual activity: Yes     Birth control/protection: I.U.D.  Other Topics Concern   Not on file  Social History Narrative   Not on file   Social Determinants of Health   Financial Resource Strain: Not on file  Food Insecurity: Not on file  Transportation Needs: Not on file  Physical Activity: Not on file  Stress: Not on file  Social Connections: Not on file  Intimate Partner Violence: Not on file    Allergies  Allergen Reactions   Penicillins Anaphylaxis   Metformin Nausea And Vomiting   Bee Venom    Sulfa Antibiotics Hives    Current Outpatient Medications  Medication Sig Dispense Refill   EPINEPHrine (EPIPEN 2-PAK) 0.3 mg/0.3 mL IJ SOAJ injection Inject 0.3 mLs (0.3 mg total) into the muscle once as needed (for severe allergic reaction). CAll 911 immediately if you have to use this medicine 1 Device 1   glipiZIDE (GLUCOTROL) 5 MG tablet Take 1 tablet (5 mg total) by mouth daily before breakfast. 30 tablet 0   hydrOXYzine (VISTARIL) 25 MG capsule Take 1 capsule (25 mg total) by mouth 3 (three) times daily as needed. 30 capsule 0   ibuprofen (ADVIL) 800 MG tablet Take 1 tablet (800 mg total) by mouth 3 (three) times daily. 21 tablet 0   ondansetron (  ZOFRAN ODT) 4 MG disintegrating tablet Take 1 tablet (4 mg total) by mouth every 8 (eight) hours as needed for nausea or vomiting. 20 tablet 0   Prenatal Vit-Fe Fumarate-FA (PRENATAL MULTIVITAMIN) TABS tablet Take 1 tablet by mouth daily at 12 noon. Daily     albuterol (PROVENTIL HFA;VENTOLIN HFA) 108 (90 Base) MCG/ACT inhaler Inhale 1-2 puffs into the lungs every 6 (six) hours as needed for wheezing or shortness of breath. (Patient not taking: Reported on 12/19/2022)     clindamycin (CLEOCIN) 300 MG capsule Take 300 mg by mouth 3 (three) times daily. (Patient not taking: Reported on 12/19/2022)     fluconazole (DIFLUCAN) 150 MG tablet take 1 tablet (150 mg) po q 3 days x 3 doses. (Patient not taking: Reported on 12/19/2022) 3 tablet 0   ibuprofen (ADVIL) 800 MG  tablet Take by mouth. (Patient not taking: Reported on 12/19/2022)     lisinopril (ZESTRIL) 10 MG tablet Take 1 tablet (10 mg total) by mouth daily. (Patient not taking: Reported on 12/19/2022) 30 tablet 0   metroNIDAZOLE (FLAGYL) 500 MG tablet Take 1 tablet (500 mg total) by mouth 2 (two) times daily. (Patient not taking: Reported on 12/19/2022) 14 tablet 0   No current facility-administered medications for this visit.    REVIEW OF SYSTEMS:   denotes positive finding,  denotes negative finding Cardiac  Comments:  Chest pain or chest pressure:    Shortness of breath upon exertion:    Short of breath when lying flat:    Irregular heart rhythm:        Vascular    Pain in calf, thigh, or hip brought on by ambulation:    Pain in feet at night that wakes you up from your sleep:     Blood clot in your veins:    Leg swelling:  x left      Pulmonary    Oxygen at home:    Productive cough:     Wheezing:         Neurologic    Sudden weakness in arms or legs:     Sudden numbness in arms or legs:     Sudden onset of difficulty speaking or slurred speech:    Temporary loss of vision in one eye:     Problems with dizziness:         Gastrointestinal    Blood in stool:     Vomited blood:         Genitourinary    Burning when urinating:     Blood in urine:        Psychiatric    Major depression:         Hematologic    Bleeding problems:    Problems with blood clotting too easily:        Skin    Rashes or ulcers:        Constitutional    Fever or chills:      PHYSICAL EXAM: Vitals:   02/12/23 1101  BP: 136/83  Pulse: 73  Resp: 16  Temp: 97.8 F (36.6 C)  TempSrc: Temporal  SpO2: 98%  Weight: (!) 327 lb (148.3 kg)  Height:  (1.778 m)    GENERAL: The patient is a well-nourished female, in no acute distress. The vital signs are documented above. CARDIAC: There is a regular rate and rhythm.  VASCULAR:  Bilateral DP pulses palpable Significant  lipodermatosclerosis left medial calf above the ankle with skin thickening  No  open ulcerations PULMONARY: No respiratory distress. ABDOMEN: Soft and non-tender. MUSCULOSKELETAL: There are no major deformities or cyanosis. NEUROLOGIC: No focal weakness or paresthesias are detected. PSYCHIATRIC: The patient has a normal affect.   DATA:   Lower Venous Reflux Study   Patient Name:  Brittany Oneill  Date of Exam:   02/11/2023  Medical Rec #: [id number]         Accession #:    [id number]  Date of Birth: June 03, 1986        Patient Gender: F  Patient Age:   60 years  Exam Location:  [facility]  Procedure:      VAS [place] LOWER EXTREMITY VENOUS REFLUX  Referring Phys: Sherald Hess    ---------------------------------------------------------------------------  -----    Indications: Pain, and Swelling.    Risk Factors: Obesity.  Limitations: Body habitus.  Comparison Study: 02/20/22 LT LE Reflux   Performing Technologist: Lowell Guitar RVT, RDMS     Examination Guidelines: A complete evaluation includes B-mode imaging,  spectral  Doppler, color Doppler, and power Doppler as needed of all accessible  portions  of each vessel. Bilateral testing is considered an integral part of a  complete  examination. Limited examinations for reoccurring indications may be  performed  as noted. The reflux portion of the exam is performed with the patient in  reverse Trendelenburg.  Significant venous reflux is defined as >500 ms in the superficial venous  system, and >1 second in the deep venous system.     +--------------+---------+------+-----------+------------+---------+  LEFT         Reflux NoRefluxReflux TimeDiameter cmsComments                           Yes                                    +--------------+---------+------+-----------+------------+---------+  CFV                    yes   >1 second                         +--------------+---------+------+-----------+------------+---------+  FV prox                 yes   >1 second                        +--------------+---------+------+-----------+------------+---------+  FV mid                  yes   >1 second                        +--------------+---------+------+-----------+------------+---------+  FV dist       no                                               +--------------+---------+------+-----------+------------+---------+  Popliteal              yes   >1 second                        +--------------+---------+------+-----------+------------+---------+  GSV at [facility]  yes    >500 ms      1.20               +--------------+---------+------+-----------+------------+---------+  GSV prox thighno                            0.37               +--------------+---------+------+-----------+------------+---------+  GSV mid thigh           yes    >500 ms      0.53    branching  +--------------+---------+------+-----------+------------+---------+  GSV dist thigh          yes    >500 ms      0.49    branching  +--------------+---------+------+-----------+------------+---------+  GSV at knee   no                            0.30    branching  +--------------+---------+------+-----------+------------+---------+  GSV prox calf no                            0.24               +--------------+---------+------+-----------+------------+---------+  SSV Pop Fossa no                            0.28               +--------------+---------+------+-----------+------------+---------+  SSV prox calf no                            0.21               +--------------+---------+------+-----------+------------+---------+         Summary:  Left:  - No evidence of deep vein thrombosis seen in the left lower extremity,  from the common femoral through the popliteal veins.  - No evidence  of superficial venous thrombosis in the left lower  extremity.    - Venous reflux is noted in the left common femoral vein.  - Venous reflux is noted in the left sapheno-femoral junction.  - Venous reflux is noted in the left greater saphenous vein in the thigh.  - Venous reflux is noted in the left femoral vein.  - Venous reflux is noted in the left popliteal vein.    *See table(s) above for measurements and observations.   Electronically signed by Coral Else MD on 02/11/2023 at 3:43:30 PM.   Assessment/Plan:  37 year old female presents for interval follow-up of her left lower extremity swelling with evidence of skin changes consistent with chronic venous stasis/insufficiency.  She has been elevating her leg as well as wearing her compression stocking.  She is on her feet for long periods of time at work that makes elevation difficult at times..  Her reflux pattern today shows significant reflux in the left saphenous vein from the junction to the distal thigh that may now be amendable to laser ablation (the last study showed more focal reflux).  I would like for her to see one of my partners in follow-up for further evaluation of possible laser ablation.  I discussed she should continue elevating the leg, wearing compression stockings, exercise and weight loss as basic conservative therapy.  Cephus Shelling, MD [facility] [place] Office: [phone]

## 2023-02-13 ENCOUNTER — Ambulatory Visit: Payer: Commercial Managed Care - HMO

## 2023-02-15 ENCOUNTER — Ambulatory Visit: Payer: Commercial Managed Care - HMO

## 2023-02-15 DIAGNOSIS — M6281 Muscle weakness (generalized): Secondary | ICD-10-CM

## 2023-02-15 DIAGNOSIS — R293 Abnormal posture: Secondary | ICD-10-CM

## 2023-02-15 DIAGNOSIS — G8929 Other chronic pain: Secondary | ICD-10-CM

## 2023-02-15 DIAGNOSIS — M542 Cervicalgia: Secondary | ICD-10-CM
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
## 2023-02-20 ENCOUNTER — Encounter: Payer: Commercial Managed Care - HMO | Admitting: Physical Therapy

## 2023-02-25 ENCOUNTER — Ambulatory Visit: Payer: Commercial Managed Care - HMO | Attending: Neurosurgery | Admitting: Physical Therapy

## 2023-02-25 DIAGNOSIS — G8929 Other chronic pain: Secondary | ICD-10-CM | POA: Insufficient documentation

## 2023-02-25 DIAGNOSIS — R293 Abnormal posture: Secondary | ICD-10-CM | POA: Insufficient documentation

## 2023-02-25 DIAGNOSIS — M25511 Pain in right shoulder: Secondary | ICD-10-CM | POA: Diagnosis present

## 2023-02-25 DIAGNOSIS — M6281 Muscle weakness (generalized): Secondary | ICD-10-CM | POA: Insufficient documentation

## 2023-02-25 DIAGNOSIS — M542 Cervicalgia: Secondary | ICD-10-CM | POA: Diagnosis present

## 2023-02-25 DIAGNOSIS — M25512 Pain in left shoulder: Secondary | ICD-10-CM | POA: Insufficient documentation
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
## 2023-03-04 ENCOUNTER — Ambulatory Visit: Payer: Commercial Managed Care - HMO

## 2023-03-11 ENCOUNTER — Ambulatory Visit: Payer: Commercial Managed Care - HMO | Admitting: Physical Therapy

## 2023-03-11 ENCOUNTER — Encounter: Payer: Self-pay | Admitting: Physical Therapy

## 2023-03-11 DIAGNOSIS — M6281 Muscle weakness (generalized): Secondary | ICD-10-CM

## 2023-03-11 DIAGNOSIS — M542 Cervicalgia: Secondary | ICD-10-CM

## 2023-03-11 DIAGNOSIS — G8929 Other chronic pain: Secondary | ICD-10-CM

## 2023-03-11 DIAGNOSIS — R293 Abnormal posture: Secondary | ICD-10-CM
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
## 2023-05-13 ENCOUNTER — Ambulatory Visit: Payer: Commercial Managed Care - HMO | Admitting: Surgery

## 2023-05-19 IMAGING — CT CT ABD-PELV W/ CM
2 of 5 series · 16 of 46 positions shown, 18 images · IV contrast (omnipaque)
Comparison: CT 12/20/2020

CLINICAL DATA: Nausea and vomiting.

EXAM:
CT ABDOMEN AND PELVIS WITH CONTRAST
TECHNIQUE: Multidetector CT imaging of the abdomen and pelvis was performed
using the standard protocol following bolus administration of
intravenous contrast.
CONTRAST:  100mL OMNIPAQUE IOHEXOL 300 MG/ML  SOLN

[Series 2: axial st · axial · 0.98mm/px · z∈[-284,+171]mm · 13 of 107 slices shown, 15 images]
[im 8/107  soft-tissue]
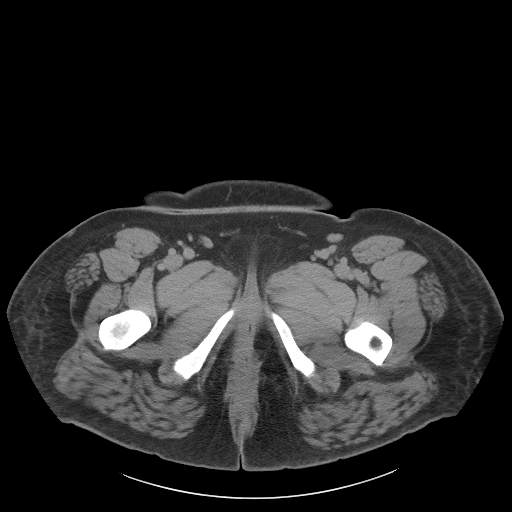
[im 8/107  bone]
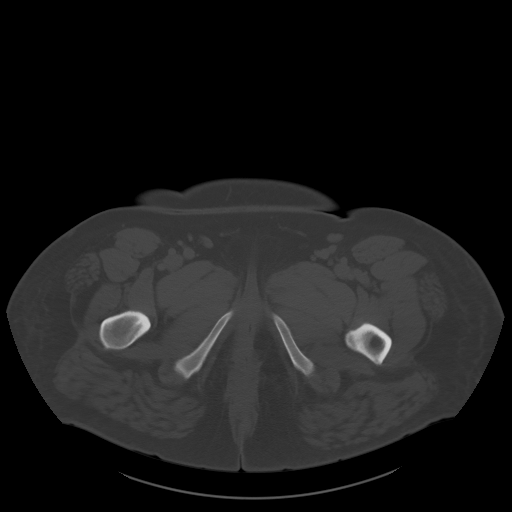
[im 15/107  soft-tissue]
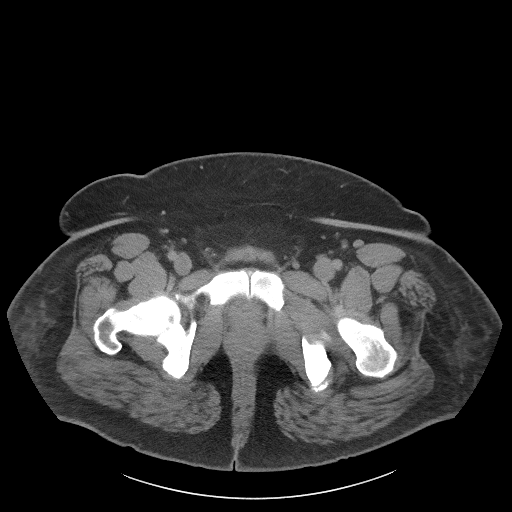
[im 22/107  soft-tissue]
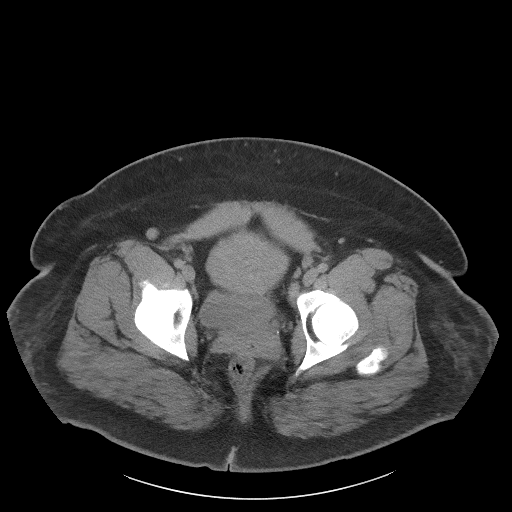
[im 29/107  soft-tissue]
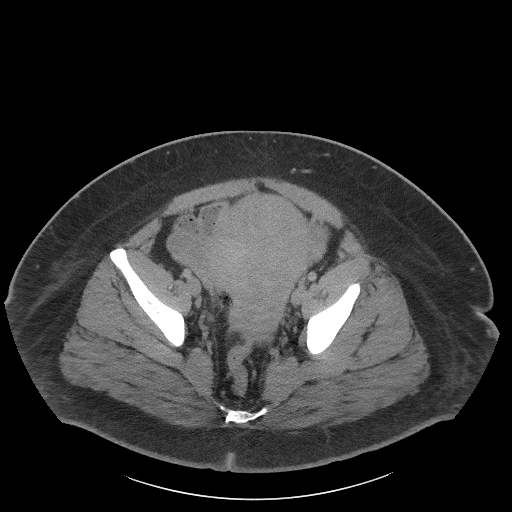
[im 36/107  soft-tissue]
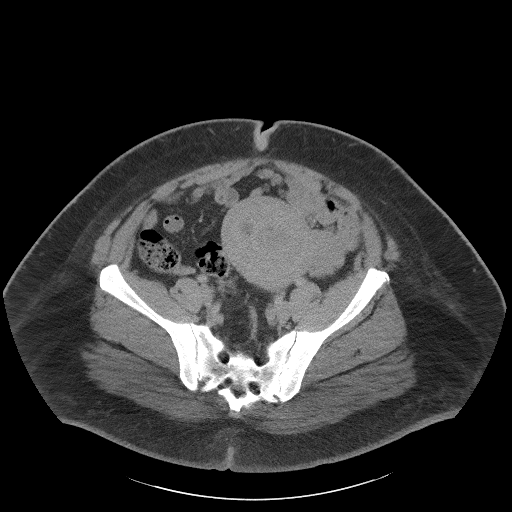
[im 43/107  soft-tissue]
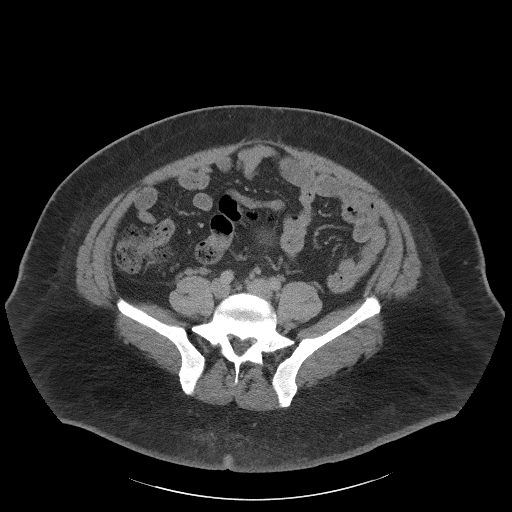
[im 57/107  soft-tissue]
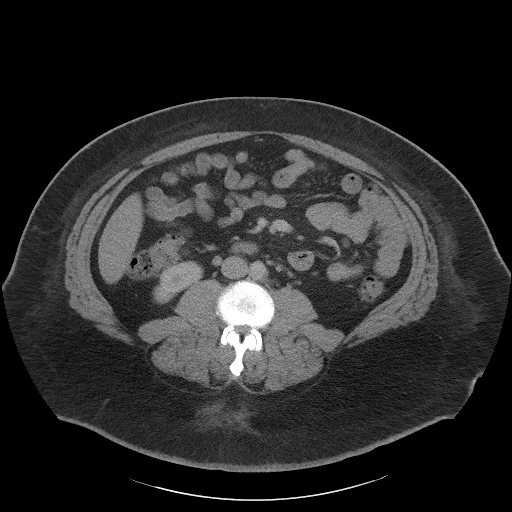
[im 64/107  soft-tissue]
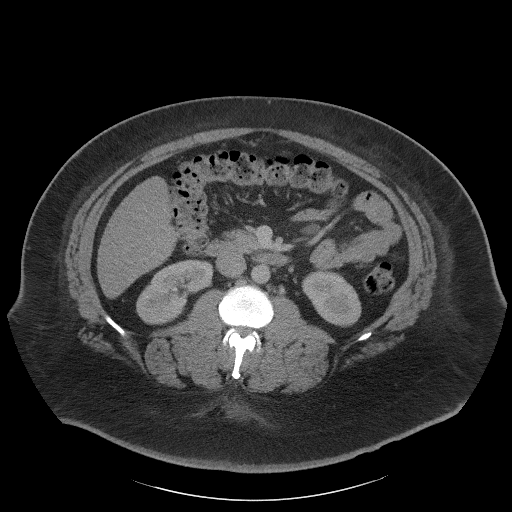
[im 71/107  soft-tissue]
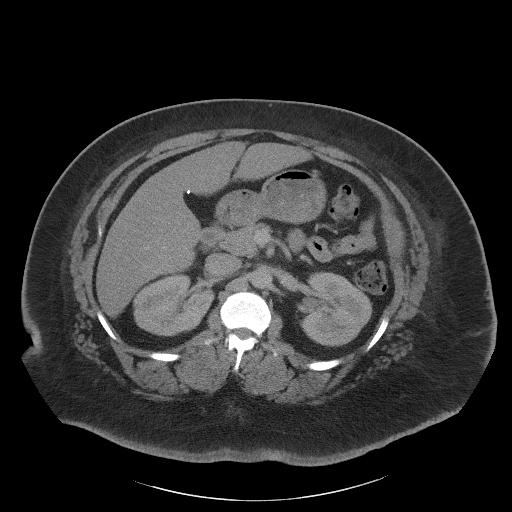
[im 71/107  bone]
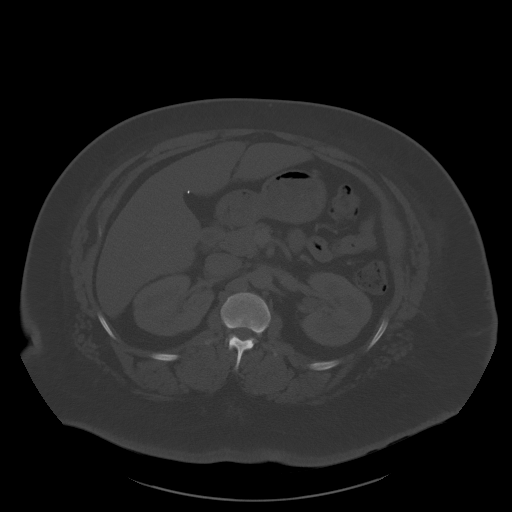
[im 78/107  soft-tissue]
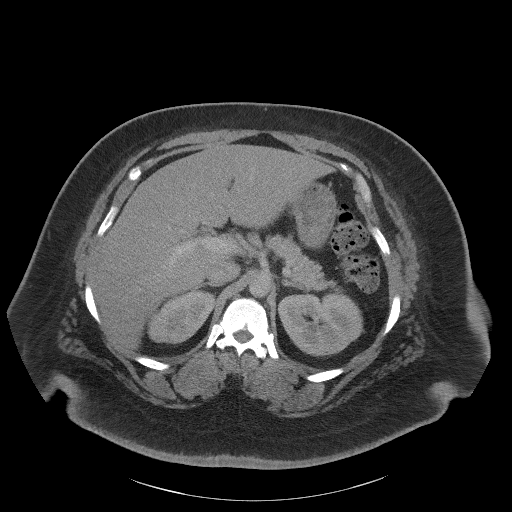
[im 85/107  soft-tissue]
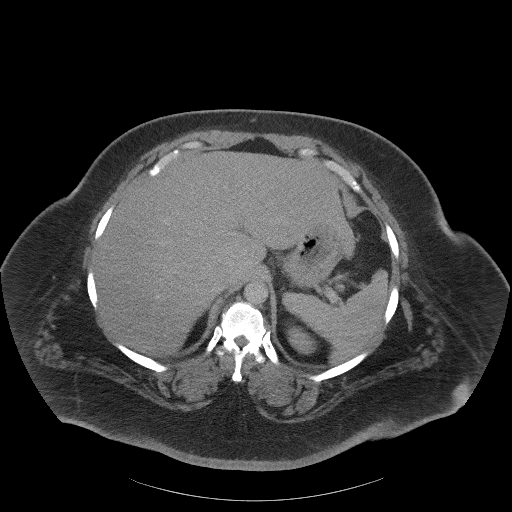
[im 92/107  soft-tissue]
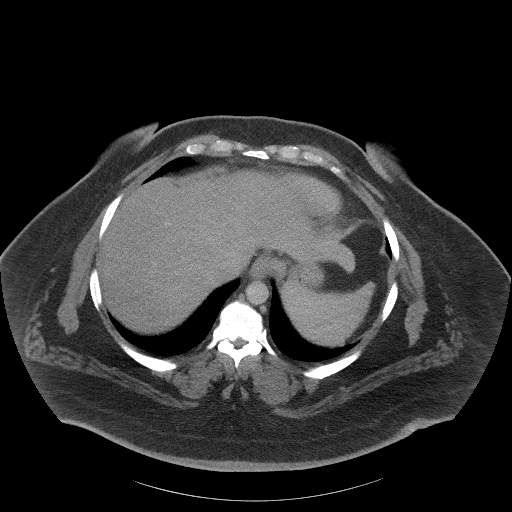
[im 99/107  soft-tissue]
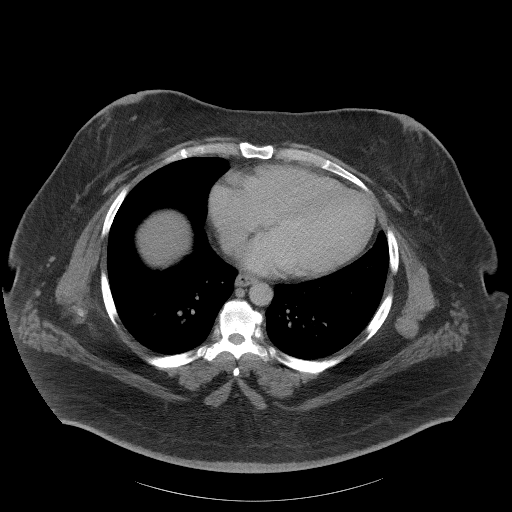

[Series 5: coronal st · coronal · 0.96mm/px · 3 of 122 slices shown]
[im 41/122  soft-tissue]
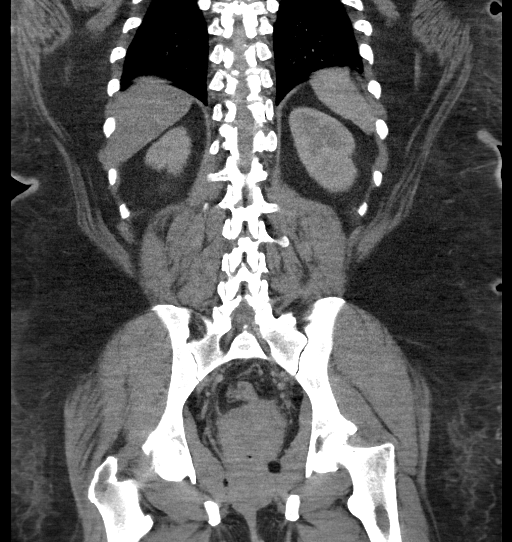
[im 54/122  soft-tissue]
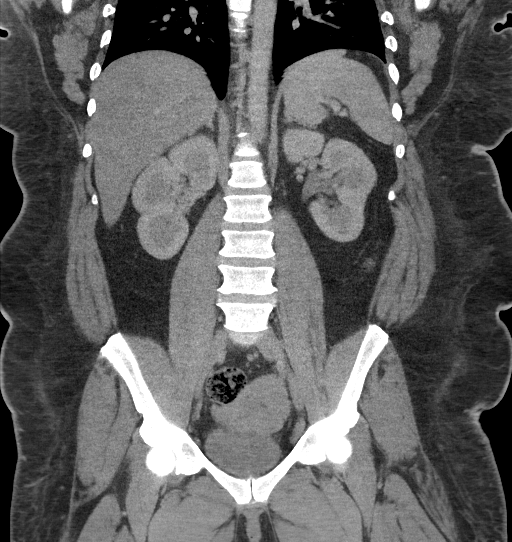
[im 68/122  soft-tissue]
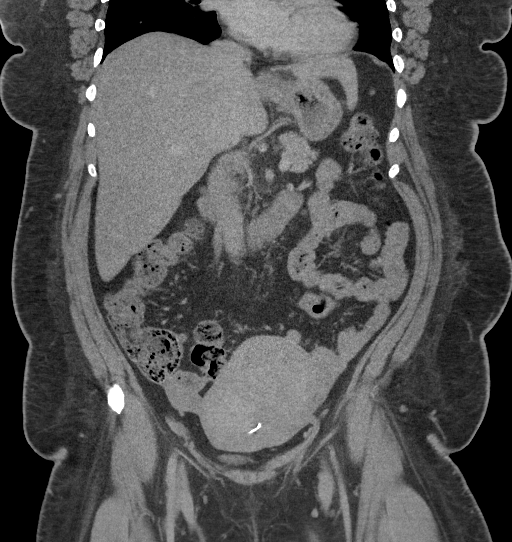

[16 of 46 positions shown; findings below may reference images not displayed]

FINDINGS: Lower chest: No acute airspace disease or pleural effusion. Upper
normal heart size.

Hepatobiliary: Enlarged liver spanning 24.6 cm cranial caudal with
steatosis. No focal liver abnormality. Clips in the gallbladder
fossa postcholecystectomy. No biliary dilatation.

Pancreas: No ductal dilatation or inflammation.

Spleen: Normal in size without focal abnormality.

Adrenals/Urinary Tract: Normal adrenal glands. No hydronephrosis or
perinephric edema. Homogeneous renal enhancement with symmetric
excretion on delayed phase imaging. Urinary bladder is partially
distended without wall thickening.

Stomach/Bowel: Unremarkable stomach containing ingested contents.
There is no small bowel obstruction or inflammation. Normal
appendix. Mild fecalization of distal small bowel contents. Moderate
colonic stool burden. Transverse and sigmoid colon are tortuous. No
colonic wall thickening or pericolonic edema.

Vascular/Lymphatic: Normal caliber abdominal aorta. Patent portal
vein. No acute vascular findings. Prominent lymph nodes in the
central small bowel mesentery measuring up to 10 mm, stable from
prior exam, typically reactive. No suspicious abdominopelvic
adenopathy.

Reproductive: Enlarged uterus with multiple fibroids, including
dominant fibroid in the fundus spanning 9 cm. Central low density
may represent necrosis. IUD is in stable position, likely distorted
by adjacent fibroids. Ovaries are not well delineated. No adnexal
mass.

Other: No free air, free fluid, or intra-abdominal fluid collection.

Musculoskeletal: There are no acute or suspicious osseous
abnormalities. Mild thoracic spondylosis.
IMPRESSION: 1. No acute abnormality in the abdomen/pelvis.
2. Hepatomegaly and hepatic steatosis.
3. Enlarged uterus with multiple fibroids, including dominant 9 cm
fibroid in the fundus.

## 2023-06-06 ENCOUNTER — Telehealth: Payer: Self-pay | Admitting: Surgery

## 2023-06-10 ENCOUNTER — Ambulatory Visit: Payer: Commercial Managed Care - HMO | Admitting: Surgery

## 2023-07-08 ENCOUNTER — Ambulatory Visit: Payer: Commercial Managed Care - HMO | Admitting: Surgery

## 2023-08-19 ENCOUNTER — Ambulatory Visit: Payer: Commercial Managed Care - HMO | Admitting: Surgery

## 2023-10-14 ENCOUNTER — Encounter: Payer: Self-pay | Admitting: Surgery

## 2023-10-14 ENCOUNTER — Ambulatory Visit (INDEPENDENT_AMBULATORY_CARE_PROVIDER_SITE_OTHER): Payer: Commercial Managed Care - HMO | Admitting: Surgery

## 2023-10-14 VITALS — BP 146/91 | HR 67 | Temp 97.9°F | Resp 20 | Ht 70.0 in | Wt 327.0 lb

## 2023-10-14 DIAGNOSIS — I83893 Varicose veins of bilateral lower extremities with other complications: Secondary | ICD-10-CM

## 2023-10-14 NOTE — Progress Notes (Signed)
[facility]  Patient name: Brittany Oneill MRN: [id number] DOB: [birth year] Sex: female   REASON FOR VISIT:    Follow up  HISOTRY OF PRESENT ILLNESS:    Brittany Oneill is a 37 y.o. female who was initially evaluated by Dr. Chestine Spore for left leg swelling.  This has been going on for 3 years.  She denies any history of DVT.  Her symptoms are worse at the end of the day.  She has been wearing 20-30 compression socks.  This is significantly impacting her work.  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  She says her leg is okay in the morning but throughout the day it becomes more more painful.   PAST MEDICAL HISTORY:   Past Medical History:  Diagnosis Date   Arthritis    Asthma    Back pain    Diabetes mellitus without complication (HCC)    Migraines    Scoliosis      FAMILY HISTORY:   Family History  Problem Relation Age of Onset   Hypertension Mother    Hypertension Father    Diabetes Father     SOCIAL HISTORY:   Social History   Tobacco Use   Smoking status: Never   Smokeless tobacco: Never  Substance Use Topics   Alcohol use: Yes    Comment: social     ALLERGIES:   Allergies  Allergen Reactions   Penicillins Anaphylaxis   Metformin Nausea And Vomiting   Bee Venom    Sulfa Antibiotics Hives     CURRENT MEDICATIONS:   Current Outpatient Medications  Medication Sig Dispense Refill   EPINEPHrine (EPIPEN 2-PAK) 0.3 mg/0.3 mL IJ SOAJ injection Inject 0.3 mLs (0.3 mg total) into the muscle once as needed (for severe allergic reaction). CAll 911 immediately if you have to use this medicine 1 Device 1   glipiZIDE (GLUCOTROL) 5 MG tablet Take 1 tablet (5 mg total) by mouth daily before breakfast. 30 tablet 0   hydrOXYzine (VISTARIL) 25 MG capsule Take 1 capsule (25 mg total) by mouth 3 (three) times daily as needed. 30 capsule 0   ibuprofen (ADVIL) 800 MG tablet Take 1 tablet (800  mg total) by mouth 3 (three) times daily. 21 tablet 0   ondansetron (ZOFRAN ODT) 4 MG disintegrating tablet Take 1 tablet (4 mg total) by mouth every 8 (eight) hours as needed for nausea or vomiting. 20 tablet 0   Prenatal Vit-Fe Fumarate-FA (PRENATAL MULTIVITAMIN) TABS tablet Take 1 tablet by mouth daily at 12 noon. Daily     albuterol (PROVENTIL HFA;VENTOLIN HFA) 108 (90 Base) MCG/ACT inhaler Inhale 1-2 puffs into the lungs every 6 (six) hours as needed for wheezing or shortness of breath. (Patient not taking: Reported on 12/19/2022)     clindamycin (CLEOCIN) 300 MG capsule Take 300 mg by mouth 3 (three) times daily. (Patient not taking: Reported on 10/14/2023)     fluconazole (DIFLUCAN) 150 MG tablet take 1 tablet (150 mg) po q 3 days x 3 doses. (Patient not taking: Reported on 10/14/2023) 3 tablet 0   ibuprofen (ADVIL) 800 MG tablet Take by mouth. (Patient not taking: Reported on 10/14/2023)     lisinopril (ZESTRIL) 10 MG tablet Take 1 tablet (10 mg total) by mouth daily. (Patient not taking: Reported on 10/14/2023) 30 tablet 0   metroNIDAZOLE (FLAGYL) 500 MG tablet Take 1 tablet (500 mg total) by mouth 2 (two) times daily. (Patient not taking: Reported on 10/14/2023) 14  tablet 0   No current facility-administered medications for this visit.    REVIEW OF SYSTEMS:   [X]  denotes positive finding, [ ]  denotes negative finding Cardiac  Comments:  Chest pain or chest pressure:    Shortness of breath upon exertion:    Short of breath when lying flat:    Irregular heart rhythm:        Vascular    Pain in calf, thigh, or hip brought on by ambulation:    Pain in feet at night that wakes you up from your sleep:     Blood clot in your veins:    Leg swelling:  x       Pulmonary    Oxygen at home:    Productive cough:     Wheezing:         Neurologic    Sudden weakness in arms or legs:     Sudden numbness in arms or legs:     Sudden onset of difficulty speaking or slurred speech:     Temporary loss of vision in one eye:     Problems with dizziness:         Gastrointestinal    Blood in stool:     Vomited blood:         Genitourinary    Burning when urinating:     Blood in urine:        Psychiatric    Major depression:         Hematologic    Bleeding problems:    Problems with blood clotting too easily:        Skin    Rashes or ulcers:        Constitutional    Fever or chills:      PHYSICAL EXAM:   Vitals:   10/14/23 1044  BP: (!) 146/91  Pulse: 67  Resp: 20  Temp: 97.9 F (36.6 C)  SpO2: 98%  Weight: (!) 327 lb (148.3 kg)  Height: 5\' 10"  (1.778 m)    GENERAL: The patient is a well-nourished female, in no acute distress. The vital signs are documented above. CARDIAC: There is a regular rate and rhythm.  VASCULAR: SonoSite was used to evaluate the saphenous vein from the knee to the groin.  It is very dilated in the distal thigh it does narrow down in the mid thigh and then dilates back around the saphenofemoral junction.  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  She has hyperpigmentation PULMONARY: Non-labored respirations MUSCULOSKELETAL: There are no major deformities or cyanosis. NEUROLOGIC: No focal weakness or paresthesias are detected. PSYCHIATRIC: The patient has a normal affect.  STUDIES:   I have reviewed the following reflux study: +--------------+---------+------+-----------+------------+---------+  LEFT         Reflux NoRefluxReflux TimeDiameter cmsComments                           Yes                                    +--------------+---------+------+-----------+------------+---------+  CFV                    yes   >1 second                        +--------------+---------+------+-----------+------------+---------+  FV prox  yes   >1 second                        +--------------+---------+------+-----------+------------+---------+  FV mid                  yes   >1 second                         +--------------+---------+------+-----------+------------+---------+  FV dist       no                                               +--------------+---------+------+-----------+------------+---------+  Popliteal              yes   >1 second                        +--------------+---------+------+-----------+------------+---------+  GSV at SFJ              yes    >500 ms      1.20               +--------------+---------+------+-----------+------------+---------+  GSV prox thighno                            0.37               +--------------+---------+------+-----------+------------+---------+  GSV mid thigh           yes    >500 ms      0.53    branching  +--------------+---------+------+-----------+------------+---------+  GSV dist thigh          yes    >500 ms      0.49    branching  +--------------+---------+------+-----------+------------+---------+  GSV at knee   no                            0.30    branching  +--------------+---------+------+-----------+------------+---------+  GSV prox calf no                            0.24               +--------------+---------+------+-----------+------------+---------+  SSV Pop Fossa no                            0.28               +--------------+---------+------+-----------+------------+---------+  SSV prox calf no                            0.21               +--------------+---------+------+-----------+------------+---------+    MEDICAL ISSUES:   CEAP I discussed she has markedly dilated left saphenous vein down around the knee with significant reflux.  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  She has tried nonoperative therapy including exercise and compression as well as leg elevation and still has significant challenges.  I think she would be an  excellent candidate for laser ablation  of the left saphenous vein with 10-20 stabs.  We will work on approval.  In the meantime she will continue to keep her legs elevated and wear her compression.    Charlena Cross, MD, FACS [facility] [phone] Pager [phone]

## 2023-10-27 IMAGING — CT CT ABD-PELV W/ CM
2 of 4 series · 17 of 46 positions shown, 19 images · IV contrast (Omnipaque)
Comparison: CT 05/21/2021

CLINICAL DATA: Left lower quadrant pain

EXAM:
CT ABDOMEN AND PELVIS WITH CONTRAST
TECHNIQUE: Multidetector CT imaging of the abdomen and pelvis was performed
using the standard protocol following bolus administration of
intravenous contrast.
CONTRAST:  100mL OMNIPAQUE IOHEXOL 300 MG/ML  SOLN

[Series 2: axial st · axial · 0.98mm/px · z∈[-604,-134]mm · 14 of 104 slices shown, 16 images]
[im 5/104  soft-tissue]
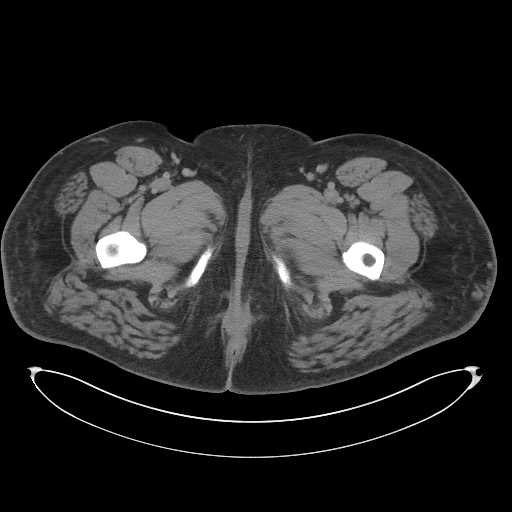
[im 5/104  bone]
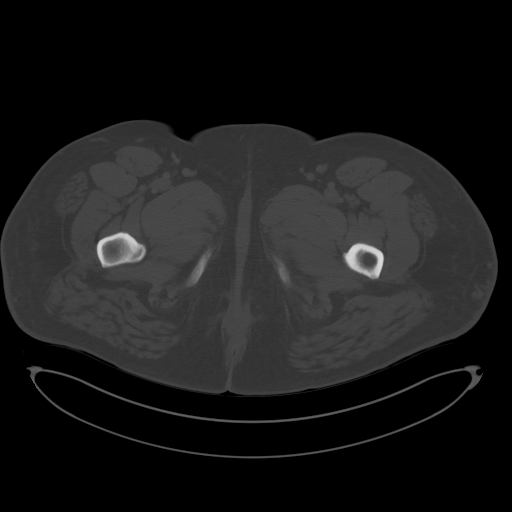
[im 14/104  soft-tissue]
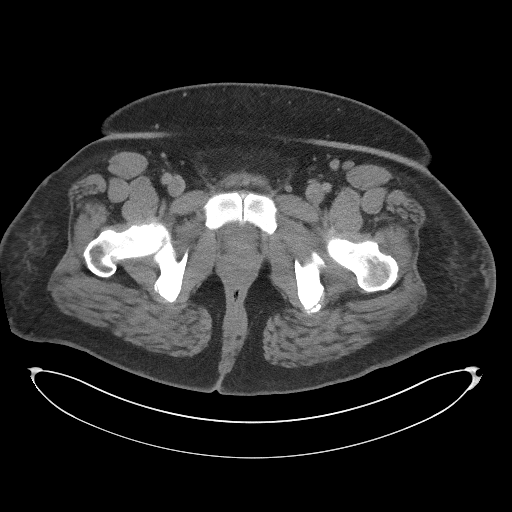
[im 18/104  soft-tissue]
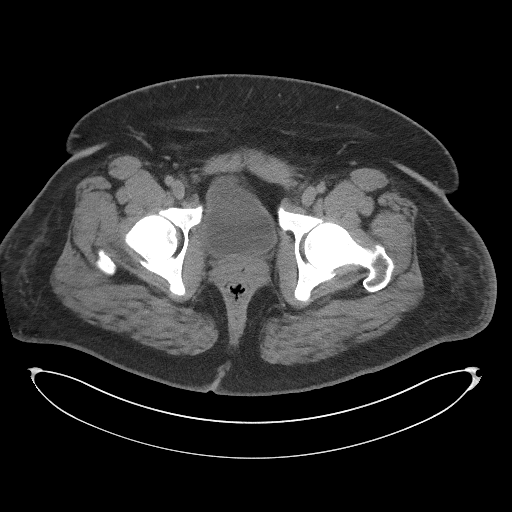
[im 27/104  soft-tissue]
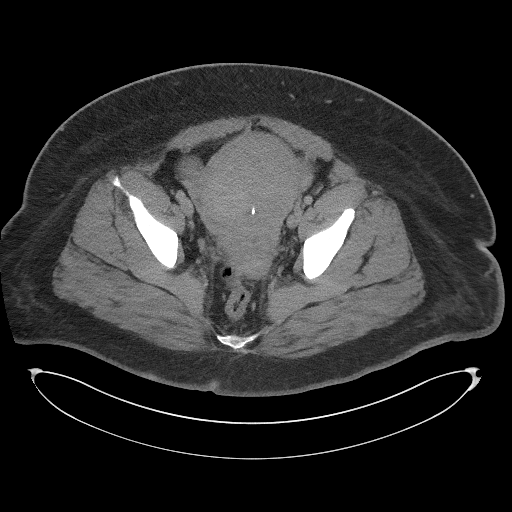
[im 36/104  soft-tissue]
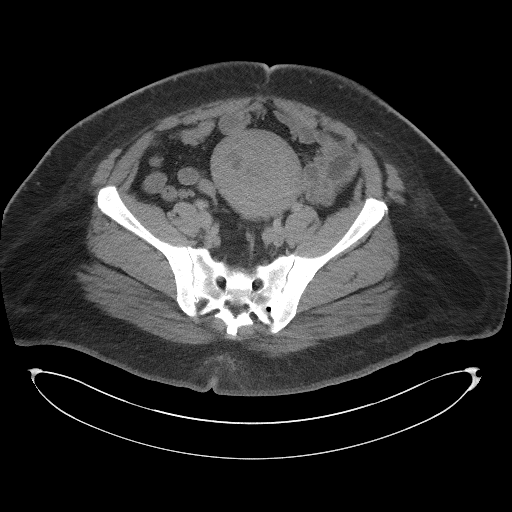
[im 41/104  soft-tissue]
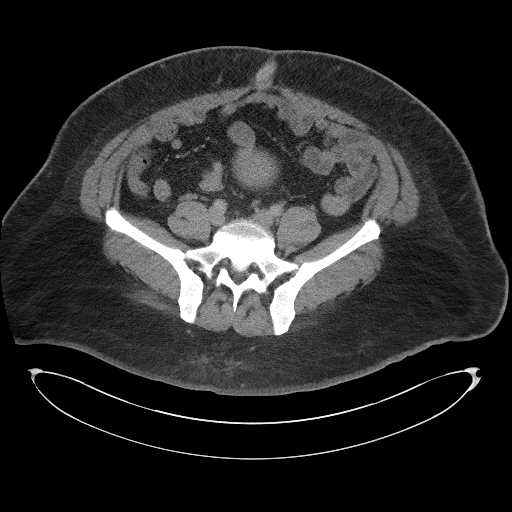
[im 50/104  soft-tissue]
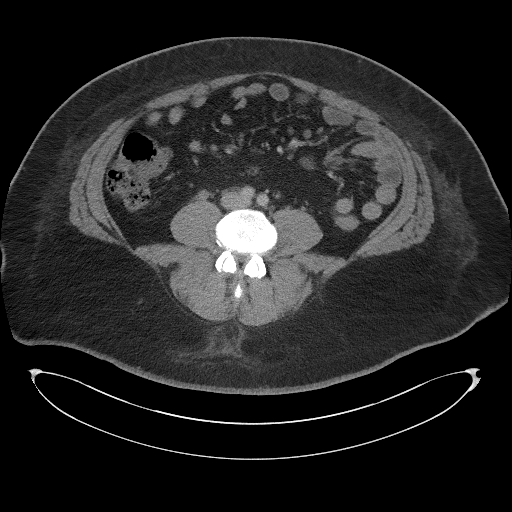
[im 54/104  soft-tissue]
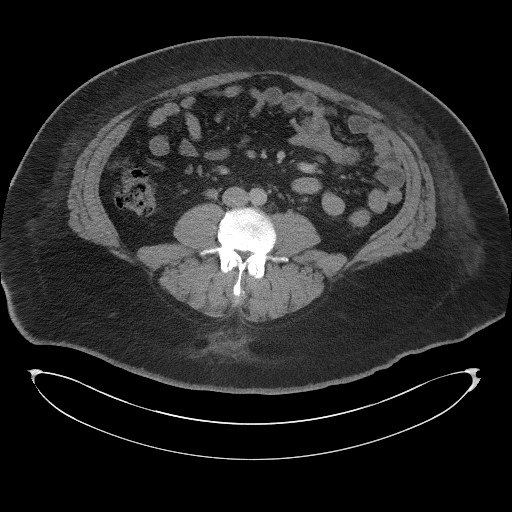
[im 63/104  soft-tissue]
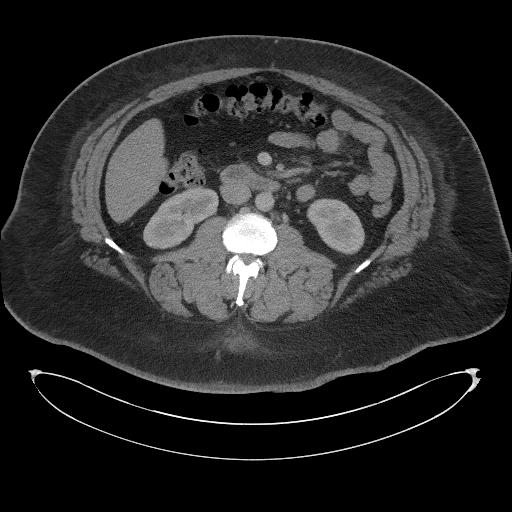
[im 63/104  bone]
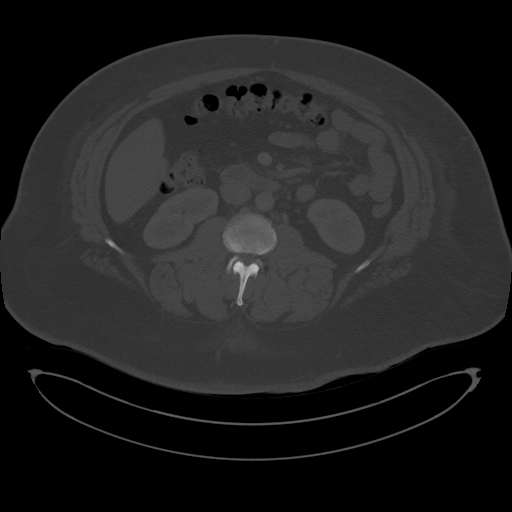
[im 68/104  soft-tissue]
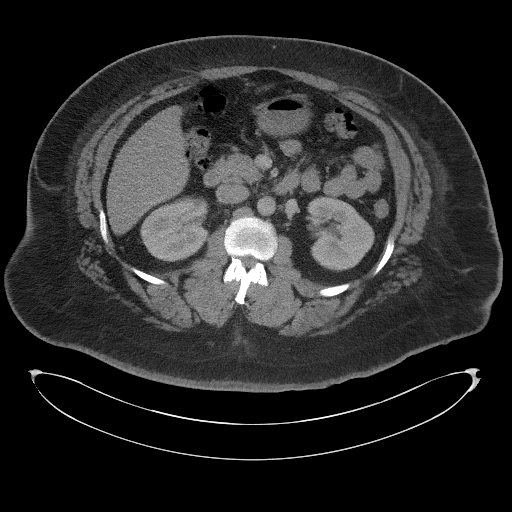
[im 77/104  soft-tissue]
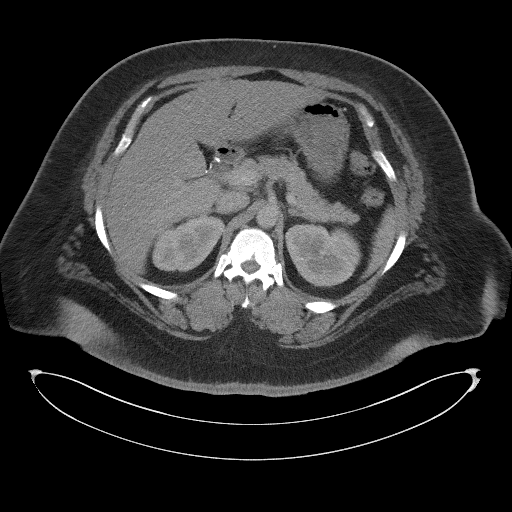
[im 86/104  soft-tissue]
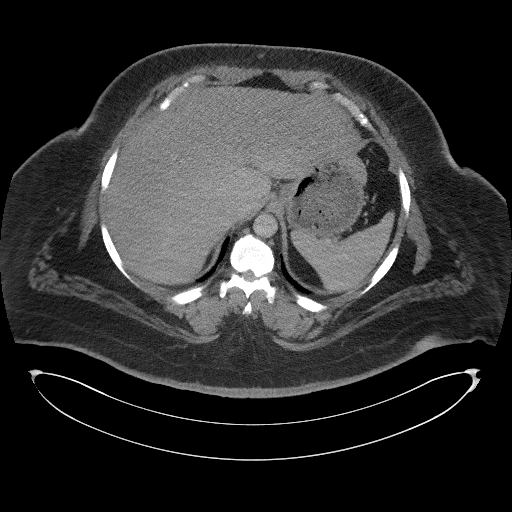
[im 90/104  soft-tissue]
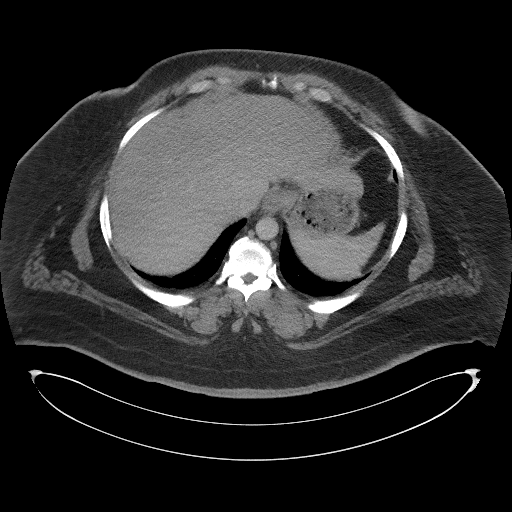
[im 99/104  soft-tissue]
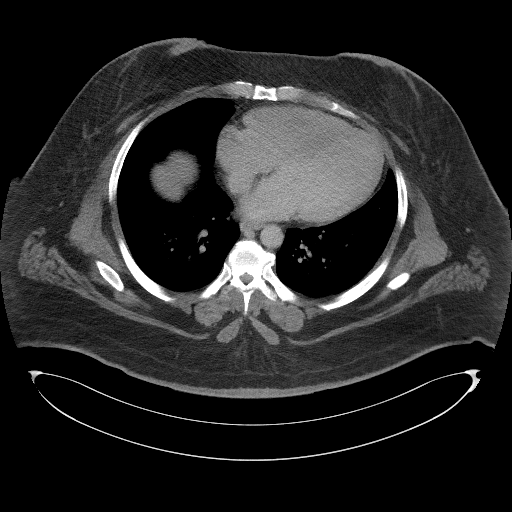

[Series 5: coronal st · coronal · 1.05mm/px · 3 of 129 slices shown]
[im 43/129  soft-tissue]
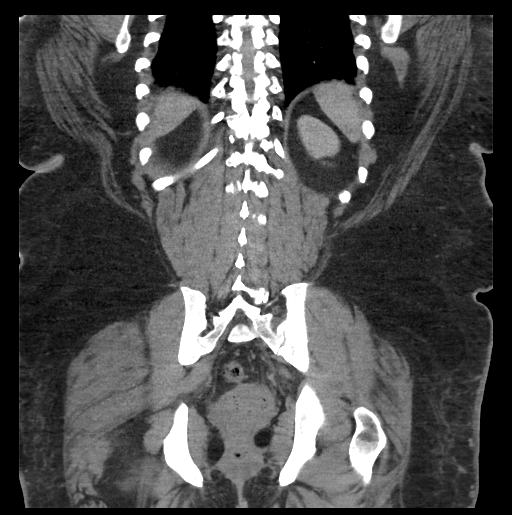
[im 57/129  soft-tissue]
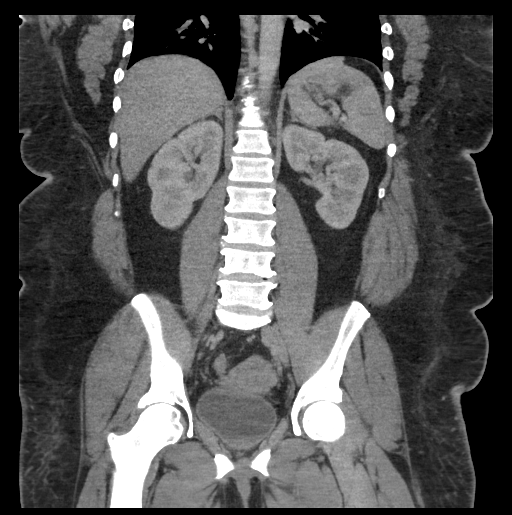
[im 72/129  soft-tissue]
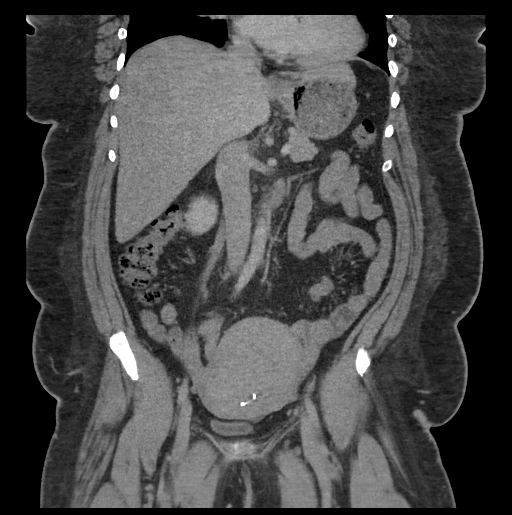

[17 of 46 positions shown; findings below may reference images not displayed]

FINDINGS: Lower chest: No acute abnormality.

Hepatobiliary: Status post cholecystectomy. Enlarged liver measuring
23 cm craniocaudad. No biliary dilatation

Pancreas: Unremarkable. No pancreatic ductal dilatation or
surrounding inflammatory changes.

Spleen: Normal in size without focal abnormality.

Adrenals/Urinary Tract: Adrenal glands are unremarkable. Kidneys are
normal, without renal calculi, focal lesion, or hydronephrosis.
Bladder is unremarkable.

Stomach/Bowel: Stomach is within normal limits. Appendix appears
normal. No evidence of bowel wall thickening, distention, or
inflammatory changes.

Vascular/Lymphatic: No significant vascular findings are present. No
enlarged abdominal or pelvic lymph nodes.

Reproductive: Enlarged uterus with multiple fibroids including large
fundal fibroid measuring up to 9.1 cm. IUD stable in position,
appearance probably distorted by fibroids. No adnexal mass.

Other: Negative for pelvic effusion or free air

Musculoskeletal: No acute osseous abnormality.
IMPRESSION: 1. No CT evidence for acute intra-abdominal or pelvic abnormality.
2. Enlarged fatty liver
3. Enlarged fibroid uterus

## 2023-11-05 ENCOUNTER — Telehealth: Payer: Self-pay | Admitting: *Deleted

## 2023-11-05 NOTE — Telephone Encounter (Signed)
 Called Brittany Oneill with pre-certification approval from [employer] for CPT [id number] and [id number] which Dr. Serene had recommended.  Checked office surgery benefit with [employer] and Brittany Oneill plan requires her to meet her $5500 deductible first ($0 has been met so far this year) and then [employer] will pay 50 % of allowable charges and Brittany Oneill would be responsible for 50% of allowable charges.  Brittany Oneill states she will defer the vein procedures at this time.

## 2024-04-30 ENCOUNTER — Other Ambulatory Visit: Payer: Self-pay

## 2024-04-30 DIAGNOSIS — E119 Type 2 diabetes mellitus without complications: Secondary | ICD-10-CM | POA: Insufficient documentation

## 2024-04-30 DIAGNOSIS — Z7984 Long term (current) use of oral hypoglycemic drugs: Secondary | ICD-10-CM | POA: Diagnosis not present

## 2024-04-30 DIAGNOSIS — R112 Nausea with vomiting, unspecified: Secondary | ICD-10-CM | POA: Insufficient documentation

## 2024-04-30 DIAGNOSIS — J45909 Unspecified asthma, uncomplicated: Secondary | ICD-10-CM | POA: Insufficient documentation

## 2024-04-30 NOTE — ED Triage Notes (Signed)
 POV,  pt report vomiting after meals since mothers day, pt report feeling weakness. Feels drain.   Alert at triage, denies pain. Pt states I loose my voice every time I vomit.

## 2024-05-01 ENCOUNTER — Encounter (HOSPITAL_BASED_OUTPATIENT_CLINIC_OR_DEPARTMENT_OTHER): Payer: Self-pay

## 2024-05-01 ENCOUNTER — Emergency Department (HOSPITAL_BASED_OUTPATIENT_CLINIC_OR_DEPARTMENT_OTHER)
Admission: EM | Admit: 2024-05-01 | Discharge: 2024-05-01 | Disposition: A | Discharge status: Home / Self Care | Attending: Emergency Medicine | Admitting: Emergency Medicine

## 2024-05-01 DIAGNOSIS — R112 Nausea with vomiting, unspecified: Secondary | ICD-10-CM

## 2024-05-01 LAB — COMPREHENSIVE METABOLIC PANEL WITH GFR
ALT: 16 U/L (ref 0–44)
AST: 17 U/L (ref 15–41)
Albumin: 3.8 g/dL (ref 3.5–5.0)
Alkaline Phosphatase: 64 U/L (ref 38–126)
Anion gap: 13 (ref 5–15)
BUN: 7 mg/dL (ref 6–20)
CO2: 23 mmol/L (ref 22–32)
Calcium: 9.1 mg/dL (ref 8.9–10.3)
Chloride: 102 mmol/L (ref 98–111)
Creatinine, Ser: 0.7 mg/dL (ref 0.44–1.00)
GFR, Estimated: >60 mL/min (ref >60)
Glucose, Bld: 242 mg/dL — ABNORMAL HIGH (ref 70–99)
Potassium: 3.5 mmol/L (ref 3.5–5.1)
Sodium: 138 mmol/L (ref 135–145)
Total Bilirubin: 0.3 mg/dL (ref 0.0–1.2)
Total Protein: 7.3 g/dL (ref 6.5–8.1)

## 2024-05-01 LAB — URINALYSIS, ROUTINE W REFLEX MICROSCOPIC
Bilirubin Urine: NEGATIVE
Glucose, UA: 250 mg/dL — AB
Ketones, ur: NEGATIVE mg/dL
Leukocytes,Ua: NEGATIVE
Nitrite: NEGATIVE
Protein, ur: 100 mg/dL — AB
Specific Gravity, Urine: >=1.03 (ref 1.005–1.030)
pH: 6 (ref 5.0–8.0)

## 2024-05-01 LAB — URINALYSIS, MICROSCOPIC (REFLEX)

## 2024-05-01 LAB — CBC
HCT: 33.9 % — ABNORMAL LOW (ref 36.0–46.0)
Hemoglobin: 11.4 g/dL — ABNORMAL LOW (ref 12.0–15.0)
MCH: 27 pg (ref 26.0–34.0)
MCHC: 33.6 g/dL (ref 30.0–36.0)
MCV: 80.1 fL (ref 80.0–100.0)
Platelets: 307 K/uL (ref 150–400)
RBC: 4.23 MIL/uL (ref 3.87–5.11)
RDW: 13.6 % (ref 11.5–15.5)
WBC: 5.7 K/uL (ref 4.0–10.5)
nRBC: 0 % (ref 0.0–0.2)

## 2024-05-01 LAB — PREGNANCY, URINE: Preg Test, Ur: NEGATIVE

## 2024-05-01 LAB — LIPASE, BLOOD: Lipase: 43 U/L (ref 11–51)

## 2024-05-01 MED ORDER — METOCLOPRAMIDE HCL 10 MG PO TABS: 10.0000 mg | ORAL_TABLET | Freq: Three times a day (TID) | ORAL | 0 refills | Status: AC | PRN

## 2024-05-01 MED ORDER — ONDANSETRON 4 MG PO TBDP
4.0000 mg | ORAL_TABLET | Freq: Once | ORAL | Status: AC | PRN
  Administered 2024-05-01: 4 mg via ORAL
  Filled 2024-05-01: qty 1

## 2024-05-01 NOTE — ED Provider Notes (Signed)
 [facility] Provider Note   CSN: [id number] Arrival date & time: 04/30/24  2342     Patient presents with: No chief complaint on file.   Brittany Oneill is a 38 y.o. female.   The history is provided by the patient.   Brittany Oneill is a 38 y.o. female who presents to the Emergency Department complaining of vomiting.  She presents to the emergency department for evaluation of vomiting for 2 months.  She states she can eat bread and fruit without difficulty but if she tries to eat any meal such as eating out of the restaurant she vomits either immediately or within 1 hour.  She also gets hoarseness related to her vomiting.  No associated fever.  She has chronically loose stools due to not having a gallbladder, these are at her baseline.  No abdominal pain, dysuria.  She has chronic headaches and these are at her baseline.  Has a history of asthma, diabetes.     Prior to Admission medications   Medication Sig Start Date End Date Taking? Authorizing Provider  metoCLOPramide  (REGLAN ) 10 MG tablet Take 1 tablet (10 mg total) by mouth every 8 (eight) hours as needed for nausea. 05/01/24  Yes Griselda Norris, MD  albuterol (PROVENTIL HFA;VENTOLIN HFA) 108 (90 Base) MCG/ACT inhaler Inhale 1-2 puffs into the lungs every 6 (six) hours as needed for wheezing or shortness of breath. Patient not taking: Reported on 12/19/2022    [provider]  clindamycin (CLEOCIN) 300 MG capsule Take 300 mg by mouth 3 (three) times daily. Patient not taking: Reported on 10/14/2023 04/07/22   [provider]  EPINEPHrine  (EPIPEN  2-PAK) 0.3 mg/0.3 mL IJ SOAJ injection Inject 0.3 mLs (0.3 mg total) into the muscle once as needed (for severe allergic reaction). CAll 911 immediately if you have to use this medicine 12/14/16   Jarold Olam HERO, PA-C  fluconazole  (DIFLUCAN ) 150 MG tablet take 1 tablet (150 mg) po q 3 days x 3 doses. Patient not taking:  Reported on 10/14/2023 05/10/22   Cathlyn JAYSON Nikki Bobie FORBES, MD  glipiZIDE  (GLUCOTROL ) 5 MG tablet Take 1 tablet (5 mg total) by mouth daily before breakfast. 10/29/21   Emelia Sluder, PA-C  hydrOXYzine  (VISTARIL ) 25 MG capsule Take 1 capsule (25 mg total) by mouth 3 (three) times daily as needed. 12/17/16   Kirichenko, Tatyana, PA-C  ibuprofen  (ADVIL ) 800 MG tablet Take by mouth. Patient not taking: Reported on 10/14/2023 12/08/21   [provider]  ibuprofen  (ADVIL ) 800 MG tablet Take 1 tablet (800 mg total) by mouth 3 (three) times daily. 08/26/22   Palumbo, April, MD  lisinopril  (ZESTRIL ) 10 MG tablet Take 1 tablet (10 mg total) by mouth daily. Patient not taking: Reported on 10/14/2023 10/29/21   Emelia Sluder, PA-C  metroNIDAZOLE  (FLAGYL ) 500 MG tablet Take 1 tablet (500 mg total) by mouth 2 (two) times daily. Patient not taking: Reported on 10/14/2023 05/10/22   Amundson C Silva, Brook E, MD  ondansetron  (ZOFRAN  ODT) 4 MG disintegrating tablet Take 1 tablet (4 mg total) by mouth every 8 (eight) hours as needed for nausea or vomiting. 02/11/22   Raford Lenis, MD  Prenatal Vit-Fe Fumarate-FA (PRENATAL MULTIVITAMIN) TABS tablet Take 1 tablet by mouth daily at 12 noon. Daily    [provider]    Allergies: Penicillins, Metformin, Bee venom, and Sulfa antibiotics    Review of Systems  All other systems reviewed and are negative.   Updated Vital  Signs BP (!) 176/89   Pulse (!) 56   Temp 98 F (36.7 C) (Oral)   Resp 14   SpO2 100%   Physical Exam Vitals and nursing note reviewed.  Constitutional:      Appearance: She is well-developed.  HENT:     Head: Normocephalic and atraumatic.  Cardiovascular:     Rate and Rhythm: Normal rate and regular rhythm.     Heart sounds: No murmur heard. Pulmonary:     Effort: Pulmonary effort is normal. No respiratory distress.     Breath sounds: Normal breath sounds.     Comments: Quiet voice Abdominal:     Palpations: Abdomen is soft.      Tenderness: There is no abdominal tenderness. There is no guarding or rebound.  Musculoskeletal:        General: No tenderness.  Skin:    General: Skin is warm and dry.  Neurological:     Mental Status: She is alert and oriented to person, place, and time.     Comments: 5/5 strength in all four extremities with sensation to light touch intact in all four extremities.   Psychiatric:        Behavior: Behavior normal.     (all labs ordered are listed, but only abnormal results are displayed) Labs Reviewed  COMPREHENSIVE METABOLIC PANEL WITH GFR - Abnormal; Notable for the following components:      Result Value   Glucose, Bld 242 (*)    All other components within normal limits  CBC - Abnormal; Notable for the following components:   Hemoglobin 11.4 (*)    HCT 33.9 (*)    All other components within normal limits  URINALYSIS, ROUTINE W REFLEX MICROSCOPIC - Abnormal; Notable for the following components:   Glucose, UA 250 (*)    Hgb urine dipstick MODERATE (*)    Protein, ur 100 (*)    All other components within normal limits  URINALYSIS, MICROSCOPIC (REFLEX) - Abnormal; Notable for the following components:   Bacteria, UA RARE (*)    All other components within normal limits  LIPASE, BLOOD  PREGNANCY, URINE    EKG: None  Radiology: No results found.   Procedures   Medications Ordered in the ED  ondansetron  (ZOFRAN -ODT) disintegrating tablet 4 mg (4 mg Oral Given 05/01/24 0004)                                    Medical Decision Making Amount and/or Complexity of Data Reviewed Labs: ordered.  Risk Prescription drug management.   Patient here for evaluation of 2 months of intermittent vomiting.  She is able to take p.o. but is limited to fruits and bread.  On examination she is nontoxic-appearing and well-hydrated.  No abdominal tenderness.  She is still having active bowel movements.  Presentation is not consistent with SBO, pancreatitis.  UA not consistent  with UTI.  CMP with hyperglycemia, otherwise unremarkable.  Discussed with patient home care for vomiting.  Discussed dietary changes.  Will start on antiemetic.  Discussed need for close PCP follow-up with return precautions, may need referral to GI if she has progressive or continued symptoms.     Final diagnoses:  Nausea and vomiting, unspecified vomiting type    ED Discharge Orders          Ordered    metoCLOPramide  (REGLAN ) 10 MG tablet  Every 8 hours PRN  05/01/24 9671               Griselda Norris, MD 05/01/24 [phone]

## 2024-06-14 ENCOUNTER — Emergency Department (HOSPITAL_COMMUNITY)

## 2024-06-14 ENCOUNTER — Emergency Department (HOSPITAL_COMMUNITY): Admission: EM | Admit: 2024-06-14 | Discharge: 2024-06-14 | Disposition: A | Discharge status: Home / Self Care

## 2024-06-14 ENCOUNTER — Encounter (HOSPITAL_COMMUNITY): Payer: Self-pay

## 2024-06-14 DIAGNOSIS — E871 Hypo-osmolality and hyponatremia: Secondary | ICD-10-CM | POA: Diagnosis not present

## 2024-06-14 DIAGNOSIS — G43809 Other migraine, not intractable, without status migrainosus: Secondary | ICD-10-CM | POA: Insufficient documentation

## 2024-06-14 LAB — CBC WITH DIFFERENTIAL/PLATELET
Abs Immature Granulocytes: 0.01 K/uL (ref 0.00–0.07)
Basophils Absolute: 0 K/uL (ref 0.0–0.1)
Basophils Relative: 0 %
Eosinophils Absolute: 0.1 K/uL (ref 0.0–0.5)
Eosinophils Relative: 2 %
HCT: 34.2 % — ABNORMAL LOW (ref 36.0–46.0)
Hemoglobin: 10.9 g/dL — ABNORMAL LOW (ref 12.0–15.0)
Immature Granulocytes: 0 %
Lymphocytes Relative: 22 %
Lymphs Abs: 1.1 K/uL (ref 0.7–4.0)
MCH: 26 pg (ref 26.0–34.0)
MCHC: 31.9 g/dL (ref 30.0–36.0)
MCV: 81.4 fL (ref 80.0–100.0)
Monocytes Absolute: 0.4 K/uL (ref 0.1–1.0)
Monocytes Relative: 8 %
Neutro Abs: 3.4 K/uL (ref 1.7–7.7)
Neutrophils Relative %: 68 %
Platelets: 290 K/uL (ref 150–400)
RBC: 4.2 MIL/uL (ref 3.87–5.11)
RDW: 13.5 % (ref 11.5–15.5)
WBC: 5 K/uL (ref 4.0–10.5)
nRBC: 0 % (ref 0.0–0.2)

## 2024-06-14 LAB — COMPREHENSIVE METABOLIC PANEL WITH GFR
ALT: 23 U/L (ref 0–44)
AST: 23 U/L (ref 15–41)
Albumin: 3.2 g/dL — ABNORMAL LOW (ref 3.5–5.0)
Alkaline Phosphatase: 47 U/L (ref 38–126)
Anion gap: 9 (ref 5–15)
BUN: 5 mg/dL — ABNORMAL LOW (ref 6–20)
CO2: 22 mmol/L (ref 22–32)
Calcium: 8.2 mg/dL — ABNORMAL LOW (ref 8.9–10.3)
Chloride: 99 mmol/L (ref 98–111)
Creatinine, Ser: 0.76 mg/dL (ref 0.44–1.00)
GFR, Estimated: >60 mL/min (ref >60)
Glucose, Bld: 308 mg/dL — ABNORMAL HIGH (ref 70–99)
Potassium: 3.3 mmol/L — ABNORMAL LOW (ref 3.5–5.1)
Sodium: 130 mmol/L — ABNORMAL LOW (ref 135–145)
Total Bilirubin: 0.3 mg/dL (ref 0.0–1.2)
Total Protein: 6.6 g/dL (ref 6.5–8.1)

## 2024-06-14 LAB — MAGNESIUM: Magnesium: 1.6 mg/dL — ABNORMAL LOW (ref 1.7–2.4)

## 2024-06-14 MED ORDER — DIPHENHYDRAMINE HCL 50 MG/ML IJ SOLN
12.5000 mg | Freq: Once | INTRAMUSCULAR | Status: AC
  Administered 2024-06-14: 12.5 mg via INTRAVENOUS
  Filled 2024-06-14: qty 1

## 2024-06-14 MED ORDER — PROCHLORPERAZINE EDISYLATE 10 MG/2ML IJ SOLN
10.0000 mg | Freq: Once | INTRAMUSCULAR | Status: AC
  Administered 2024-06-14: 10 mg via INTRAVENOUS
  Filled 2024-06-14: qty 2

## 2024-06-14 MED ORDER — MAGNESIUM OXIDE -MG SUPPLEMENT 400 (240 MG) MG PO TABS
400.0000 mg | ORAL_TABLET | Freq: Once | ORAL | Status: AC
  Administered 2024-06-14: 400 mg via ORAL
  Filled 2024-06-14: qty 1

## 2024-06-14 MED ORDER — POTASSIUM CHLORIDE CRYS ER 20 MEQ PO TBCR
40.0000 meq | EXTENDED_RELEASE_TABLET | Freq: Once | ORAL | Status: AC
  Administered 2024-06-14: 40 meq via ORAL
  Filled 2024-06-14: qty 2

## 2024-06-14 MED ORDER — SODIUM CHLORIDE 0.9 % IV BOLUS
1000.0000 mL | Freq: Once | INTRAVENOUS | Status: AC
  Administered 2024-06-14: 1000 mL via INTRAVENOUS

## 2024-06-14 MED ORDER — KETOROLAC TROMETHAMINE 15 MG/ML IJ SOLN
15.0000 mg | Freq: Once | INTRAMUSCULAR | Status: AC
  Administered 2024-06-14: 15 mg via INTRAVENOUS
  Filled 2024-06-14: qty 1

## 2024-06-14 NOTE — ED Provider Notes (Signed)
 [place] EMERGENCY DEPARTMENT AT [facility] Provider Note   CSN: [id number] Arrival date & time: 06/14/24  1807     Patient presents with: Headache   Brittany Oneill is a 38 y.o. female.   38 year old female presents today for concern of headache.  States this is consistent with her migraine headache.  She has chronic migraine.  She states she typically takes Tylenol  and ibuprofen  but today has not taken anything.  Denies any other complaints.  Blood sugars elevated but states she is stretching out her medications due to financial reasons so tends to run around this level in the recent past.  The history is provided by the patient. No language interpreter was used.       Prior to Admission medications   Medication Sig Start Date End Date Taking? Authorizing Provider  albuterol (PROVENTIL HFA;VENTOLIN HFA) 108 (90 Base) MCG/ACT inhaler Inhale 1-2 puffs into the lungs every 6 (six) hours as needed for wheezing or shortness of breath. Patient not taking: Reported on 12/19/2022    [provider]  clindamycin (CLEOCIN) 300 MG capsule Take 300 mg by mouth 3 (three) times daily. Patient not taking: Reported on 10/14/2023 04/07/22   [provider]  EPINEPHrine  (EPIPEN  2-PAK) 0.3 mg/0.3 mL IJ SOAJ injection Inject 0.3 mLs (0.3 mg total) into the muscle once as needed (for severe allergic reaction). CAll 911 immediately if you have to use this medicine 12/14/16   Jarold Olam HERO, PA-C  fluconazole  (DIFLUCAN ) 150 MG tablet take 1 tablet (150 mg) po q 3 days x 3 doses. Patient not taking: Reported on 10/14/2023 05/10/22   Cathlyn JAYSON Nikki Bobie FORBES, MD  glipiZIDE  (GLUCOTROL ) 5 MG tablet Take 1 tablet (5 mg total) by mouth daily before breakfast. 10/29/21   Emelia Sluder, PA-C  hydrOXYzine  (VISTARIL ) 25 MG capsule Take 1 capsule (25 mg total) by mouth 3 (three) times daily as needed. 12/17/16   Kirichenko, Tatyana, PA-C  ibuprofen  (ADVIL ) 800 MG tablet Take by  mouth. Patient not taking: Reported on 10/14/2023 12/08/21   [provider]  ibuprofen  (ADVIL ) 800 MG tablet Take 1 tablet (800 mg total) by mouth 3 (three) times daily. 08/26/22   Palumbo, April, MD  lisinopril  (ZESTRIL ) 10 MG tablet Take 1 tablet (10 mg total) by mouth daily. Patient not taking: Reported on 10/14/2023 10/29/21   Emelia Sluder, PA-C  metoCLOPramide  (REGLAN ) 10 MG tablet Take 1 tablet (10 mg total) by mouth every 8 (eight) hours as needed for nausea. 05/01/24   Griselda Norris, MD  metroNIDAZOLE  (FLAGYL ) 500 MG tablet Take 1 tablet (500 mg total) by mouth 2 (two) times daily. Patient not taking: Reported on 10/14/2023 05/10/22   Amundson C Silva, Brook E, MD  ondansetron  (ZOFRAN  ODT) 4 MG disintegrating tablet Take 1 tablet (4 mg total) by mouth every 8 (eight) hours as needed for nausea or vomiting. 02/11/22   Raford Lenis, MD  Prenatal Vit-Fe Fumarate-FA (PRENATAL MULTIVITAMIN) TABS tablet Take 1 tablet by mouth daily at 12 noon. Daily    [provider]    Allergies: Penicillins, Metformin, Bee venom, and Sulfa antibiotics    Review of Systems  Constitutional:  Negative for fever.  Eyes:  Negative for photophobia and visual disturbance.  Respiratory:  Negative for shortness of breath.   Cardiovascular:  Negative for chest pain.  Gastrointestinal:  Negative for abdominal pain.  Neurological:  Positive for headaches. Negative for weakness and light-headedness.    Updated Vital Signs BP (!) 173/97  Pulse 73   Temp 99.4 F (37.4 C)   Resp (!) 28   LMP 05/23/2023 (Approximate)   SpO2 100%   Physical Exam Vitals and nursing note reviewed.  Constitutional:      General: She is not in acute distress.    Appearance: Normal appearance. She is not ill-appearing.  HENT:     Head: Normocephalic and atraumatic.     Nose: Nose normal.  Eyes:     Extraocular Movements: Extraocular movements intact.     Conjunctiva/sclera: Conjunctivae normal.     Pupils:  Pupils are equal, round, and reactive to light.  Pulmonary:     Effort: Pulmonary effort is normal. No respiratory distress.  Musculoskeletal:        General: No deformity.  Skin:    Findings: No rash.  Neurological:     General: No focal deficit present.     Mental Status: She is alert and oriented to person, place, and time. Mental status is at baseline.     Comments: Nerves III through XII intact.  Good range of motion in bilateral upper and lower extremities with 5/5 strength.  Negative meningeal signs.  No pronator drift.  Normal speech.     (all labs ordered are listed, but only abnormal results are displayed) Labs Reviewed  CBC WITH DIFFERENTIAL/PLATELET - Abnormal; Notable for the following components:      Result Value   Hemoglobin 10.9 (*)    HCT 34.2 (*)    All other components within normal limits  COMPREHENSIVE METABOLIC PANEL WITH GFR - Abnormal; Notable for the following components:   Sodium 130 (*)    Potassium 3.3 (*)    Glucose, Bld 308 (*)    BUN 5 (*)    Calcium  8.2 (*)    Albumin 3.2 (*)    All other components within normal limits  MAGNESIUM  - Abnormal; Notable for the following components:   Magnesium  1.6 (*)    All other components within normal limits    EKG: EKG Interpretation Date/Time:  Sunday June 14 2024 18:46:38 EDT Ventricular Rate:  90 PR Interval:  154 QRS Duration:  91 QT Interval:  363 QTC Calculation: 445 R Axis:   19  Text Interpretation: Sinus rhythm Consider anterior infarct Compared with prior EKG from 12/25/2016 Confirmed by Gennaro Bouchard ([id number]) on 06/14/2024 6:53:44 PM  Radiology: [facility] Chest 1 View Result Date: 06/14/2024 CLINICAL DATA:  Dyspnea. EXAM: CHEST  1 VIEW COMPARISON:  Chest radiograph dated 12/25/2016. FINDINGS: Shallow inspiration with bibasilar atelectasis. No focal consolidation, pleural effusion, pneumothorax. Mild cardiomegaly. No acute osseous pathology. IMPRESSION: No active disease. Electronically Signed    By: Vanetta Chou M.D.   On: 06/14/2024 18:45     Procedures   Medications Ordered in the ED  potassium chloride  SA (KLOR-CON  M) CR tablet 40 mEq (has no administration in time range)  magnesium  oxide (MAG-OX) tablet 400 mg (has no administration in time range)  ketorolac  (TORADOL ) 15 MG/ML injection 15 mg (15 mg Intravenous Given 06/14/24 1853)  sodium chloride  0.9 % bolus 1,000 mL (1,000 mLs Intravenous New Bag/Given 06/14/24 1853)  prochlorperazine  (COMPAZINE ) injection 10 mg (10 mg Intravenous Given 06/14/24 1856)  diphenhydrAMINE  (BENADRYL ) injection 12.5 mg (12.5 mg Intravenous Given 06/14/24 1854)    Clinical Course as of 06/14/24 2113  Sun Jun 14, 2024  2048 Feels improved.  Waiting on CMP and magnesium . Husband at bedside. They state they would like to go home as they have kids that need to  get ready for school tomorrow.  Will reach out to lab on ETA of labs. [AA]  2109 Sodium 130 but when corrected for glucose of 308 it is 133.  Magnesium  1.6.  Potassium of 3.3.  I do not want magnesium  repletion but will take the potassium pills.  They will follow-up with her primary care doctor.  Will discharge. [AA]    Clinical Course User Index [AA] Hildegard Loge, PA-C                                 Medical Decision Making Amount and/or Complexity of Data Reviewed Labs: ordered. Radiology: ordered.  Risk OTC drugs. Prescription drug management.   38 year old female presents today for concern of migraine headache.  Will give migraine cocktail. No focal deficit on neurological exam.  CBC without leukocytosis.  Mild anemia around her baseline.  CMP with potassium of 3.3, glucose 308, sodium 130.  Corrected sodium 133. Potassium and magnesium  repletion ordered.  Chest x-ray without acute cardiopulmonary process. Patient significantly improved. Requesting discharge.  Discharged in stable condition.  She will follow-up with her PCP.   Final diagnoses:  Other migraine without  status migrainosus, not intractable    ED Discharge Orders     None          Hildegard Loge, PA-C 06/14/24 2117    Gennaro Bouchard L, DO 06/15/24 [phone]

## 2024-06-14 NOTE — ED Triage Notes (Signed)
 [facility]s from [facility]. Pt experience an onset of dizziness and was guided to the floor. BG 366. Pt also reports headache and bilateral leg pain.

## 2024-06-14 NOTE — Discharge Instructions (Signed)
 Follow-up with your primary care doctor.  Return for any emergent symptoms.  Your symptoms improved in the emergency department.  Your potassium was slightly low.  We gave you potassium supplement.  Your magnesium  is slightly low as well.  We discussed repletion however you prefer to follow-up with your primary care doctor regarding this.  Return for any emergent symptoms.
